# Patient Record
Sex: Female | Born: 1965 | Race: Black or African American | Hispanic: No | Marital: Married | Smoking: Never smoker
Health system: Southern US, Community
[De-identification: ages and names within clinical notes are randomized; demographics above are authoritative.]

## PROBLEM LIST (undated history)

## (undated) DIAGNOSIS — E7800 Pure hypercholesterolemia, unspecified: Secondary | ICD-10-CM

## (undated) DIAGNOSIS — E669 Obesity, unspecified: Secondary | ICD-10-CM

## (undated) DIAGNOSIS — E66811 Obesity, class 1: Secondary | ICD-10-CM

## (undated) DIAGNOSIS — B009 Herpesviral infection, unspecified: Secondary | ICD-10-CM

## (undated) DIAGNOSIS — K219 Gastro-esophageal reflux disease without esophagitis: Secondary | ICD-10-CM

## (undated) HISTORY — DX: Obesity, unspecified: E66.9

## (undated) HISTORY — DX: Pure hypercholesterolemia, unspecified: E78.00

## (undated) HISTORY — DX: Obesity, class 1: E66.811

## (undated) HISTORY — DX: Gastro-esophageal reflux disease without esophagitis: K21.9

---

## 2005-10-25 ENCOUNTER — Ambulatory Visit: Payer: Self-pay | Admitting: Internal Medicine

## 2008-01-05 ENCOUNTER — Telehealth: Payer: Self-pay | Admitting: Internal Medicine

## 2008-09-08 ENCOUNTER — Encounter: Payer: Self-pay | Admitting: Obstetrics & Gynecology

## 2008-09-08 ENCOUNTER — Ambulatory Visit: Payer: Self-pay | Admitting: Obstetrics & Gynecology

## 2008-09-09 ENCOUNTER — Encounter: Payer: Self-pay | Admitting: Obstetrics & Gynecology

## 2008-09-09 LAB — CONVERTED CEMR LAB: Clue Cells Wet Prep HPF POC: NONE SEEN

## 2008-09-13 ENCOUNTER — Encounter: Admission: RE | Admit: 2008-09-13 | Discharge: 2008-09-13 | Payer: Self-pay | Admitting: Obstetrics & Gynecology

## 2008-12-17 ENCOUNTER — Ambulatory Visit: Payer: Self-pay | Admitting: Family Medicine

## 2008-12-21 ENCOUNTER — Ambulatory Visit: Payer: Self-pay | Admitting: Family Medicine

## 2008-12-21 DIAGNOSIS — E669 Obesity, unspecified: Secondary | ICD-10-CM

## 2008-12-22 LAB — CONVERTED CEMR LAB
ALT: 22 units/L (ref 0–35)
AST: 15 units/L (ref 0–37)
Albumin: 4.3 g/dL (ref 3.5–5.2)
BUN: 16 mg/dL (ref 6–23)
Calcium: 9 mg/dL (ref 8.4–10.5)
Cholesterol: 227 mg/dL — ABNORMAL HIGH (ref 0–200)
HCT: 39.9 % (ref 36.0–46.0)
Hemoglobin: 12.4 g/dL (ref 12.0–15.0)
LDL Cholesterol: 148 mg/dL — ABNORMAL HIGH (ref 0–99)
Potassium: 4 meq/L (ref 3.5–5.3)
RDW: 14.4 % (ref 11.5–15.5)
Sodium: 137 meq/L (ref 135–145)
Total CHOL/HDL Ratio: 3.9
VLDL: 21 mg/dL (ref 0–40)

## 2009-09-05 ENCOUNTER — Telehealth: Payer: Self-pay | Admitting: Internal Medicine

## 2009-11-02 ENCOUNTER — Ambulatory Visit: Payer: Self-pay | Admitting: Obstetrics & Gynecology

## 2009-11-07 ENCOUNTER — Ambulatory Visit: Payer: Self-pay | Admitting: Family Medicine

## 2009-11-07 DIAGNOSIS — S99919A Unspecified injury of unspecified ankle, initial encounter: Secondary | ICD-10-CM

## 2009-11-07 DIAGNOSIS — S8990XA Unspecified injury of unspecified lower leg, initial encounter: Secondary | ICD-10-CM | POA: Insufficient documentation

## 2009-11-07 DIAGNOSIS — S99929A Unspecified injury of unspecified foot, initial encounter: Secondary | ICD-10-CM

## 2009-11-10 ENCOUNTER — Telehealth (INDEPENDENT_AMBULATORY_CARE_PROVIDER_SITE_OTHER): Payer: Self-pay | Admitting: *Deleted

## 2009-11-17 ENCOUNTER — Telehealth: Payer: Self-pay | Admitting: Family Medicine

## 2009-11-28 ENCOUNTER — Ambulatory Visit: Payer: Self-pay | Admitting: Family Medicine

## 2009-11-30 ENCOUNTER — Ambulatory Visit: Payer: Self-pay | Admitting: Diagnostic Radiology

## 2009-11-30 ENCOUNTER — Ambulatory Visit (HOSPITAL_BASED_OUTPATIENT_CLINIC_OR_DEPARTMENT_OTHER): Admission: RE | Admit: 2009-11-30 | Discharge: 2009-11-30 | Payer: Self-pay | Admitting: Family Medicine

## 2010-01-23 ENCOUNTER — Ambulatory Visit
Admission: RE | Admit: 2010-01-23 | Discharge: 2010-01-23 | Payer: Self-pay | Source: Home / Self Care | Attending: Family Medicine | Admitting: Family Medicine

## 2010-02-07 ENCOUNTER — Telehealth: Payer: Self-pay | Admitting: Internal Medicine

## 2010-02-14 NOTE — Assessment & Plan Note (Signed)
Summary: L knee pain x 1 dy rm 5   Vital Signs:  Patient Profile:   45 Years Old Female CC:      L knee paim x 1 dy Height:     65 inches Weight:      202 pounds O2 Sat:      100 % O2 treatment:    Room Air Temp:     98.7 degrees F oral Pulse rate:   67 / minute Pulse rhythm:   regular Resp:     16 per minute BP sitting:   105 / 73  (left arm) Cuff size:   regular  Vitals Entered By: Areta Haber CMA (November 07, 2009 7:32 PM)                  Current Allergies: No known allergies History of Present Illness Chief Complaint: L knee paim x 1 dy History of Present Illness:  Subjective:  Patient complains of slipping on wet floor in grocery store yesterday, landing on left anterior knee.  Has had persistent pain anteriorly, worse when walking.  Current Problems: CONTUSION, LEFT KNEE (ICD-924.11) KNEE INJURY, LEFT (ICD-959.7) OBESITY, UNSPECIFIED (ICD-278.00) HEALTH MAINTENANCE EXAM (ICD-V70.0) URI (ICD-465.9) FAMILY HISTORY DIABETES 1ST DEGREE RELATIVE (ICD-V18.0)   Current Meds PANTOPRAZOLE SODIUM 40 MG TBEC (PANTOPRAZOLE SODIUM) 1 tablet by mouth once daily PHENTERMINE HCL 37.5 MG CAPS (PHENTERMINE HCL) Take 1 tablet by mouth once a day in the AM.  REVIEW OF SYSTEMS Constitutional Symptoms      Denies fever, chills, night sweats, weight loss, weight gain, and fatigue.  Eyes       Denies change in vision, eye pain, eye discharge, glasses, contact lenses, and eye surgery. Ear/Nose/Throat/Mouth       Denies hearing loss/aids, change in hearing, ear pain, ear discharge, dizziness, frequent runny nose, frequent nose bleeds, sinus problems, sore throat, hoarseness, and tooth pain or bleeding.  Respiratory       Denies dry cough, productive cough, wheezing, shortness of breath, asthma, bronchitis, and emphysema/COPD.  Cardiovascular       Denies murmurs, chest pain, and tires easily with exhertion.    Gastrointestinal       Denies stomach pain, nausea/vomiting,  diarrhea, constipation, blood in bowel movements, and indigestion. Genitourniary       Denies painful urination, kidney stones, and loss of urinary control. Neurological       Denies paralysis, seizures, and fainting/blackouts. Musculoskeletal       Complains of muscle pain and decreased range of motion.      Denies joint pain, joint stiffness, redness, swelling, muscle weakness, and gout.      Comments: L knee pain x 1 dy Skin       Denies bruising, unusual mles/lumps or sores, and hair/skin or nail changes.  Psych       Denies mood changes, temper/anger issues, anxiety/stress, speech problems, depression, and sleep problems. Other Comments: Pt states she fell in grocery store on 11/06/09.   Past History:  Past Medical History: Last updated: 12/21/2008 Acid Reflux GYN - Dr. Nicholaus Bloom.     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 Never Smoked Alcohol use-no Drug use-no Regular exercise-yes Married  Risk Factors:  Alcohol Use: 0 (12/21/2008) Exercise: yes (12/17/2008)  Risk Factors: Smoking Status: never (12/21/2008)   Objective:  No acute distress   Left knee:  No effusion,  erythema, or warmth.  Knee stable, negative drawer test.  McMurray test negative.  Tenderness and mild swelling over mid-patella.  Knee has full range of motion.  Distal neurovascular intact   LEFT KNEE - COMPLETE 4+ VIEW   Comparison: None.   Findings: Imaged bones, joints and soft tissues appear normal.   IMPRESSION: Negative exam.  Assessment New Problems: CONTUSION, LEFT KNEE (ICD-924.11) KNEE INJURY, LEFT (ICD-959.7)  POSSIBLE  MILD KNEE SPRAIN  Plan New Orders: T-DG Knee Complete 4 Views*L* [73564] Ace Wraps 3-5 in/yard  [A6449] Est. Patient Level II [16109] Planning Comments:   Ace wrap applied:  wear for 3 to 5 days.   Continue ice pack several times daily until improved.  Continue ibuprofen. At risk for developing pre-patellar bursitis ([facility] information and instruction patient handout given).  Begin knee exercises Follow-up with orthopedist if not improving 10 to 14 days.   The patient and/or caregiver has been counseled thoroughly with regard to medications prescribed including dosage, schedule, interactions, rationale for use, and possible side effects and they verbalize understanding.  Diagnoses and expected course of recovery discussed and will return if not improved as expected or if the condition worsens. Patient and/or caregiver verbalized understanding.   Orders Added: 1)  T-DG Knee Complete 4 Views*L* [73564] 2)  Ace Wraps 3-5 in/yard  [A6449] 3)  Est. Patient Level II [60454]

## 2010-02-14 NOTE — Progress Notes (Signed)
  Phone Note Outgoing Call Call back at [facility] Phone [phone]   Call placed by: Lajean Saver RN,  November 10, 2009 12:30 PM Call placed to: Patient Summary of Call: Callback: No answer. Message left with reason for call and call back with questions or concerns. Reminded pateint to do knee exercises.

## 2010-02-14 NOTE — Progress Notes (Signed)
Summary: Wanting MRI  Phone Note Call from Patient Call back at [phone]- cell #   Caller: Patient Call For: Bowen Summary of Call: Pt seen in UC 1 week ago from a fall she took in [employer]. Knee is still hurting and wanted to know if she could get an MRI done. Please advise Initial call taken by: Kathlene November LPN,  November 17, 2009 3:01 PM  Follow-up for Phone Call        needs OV in our office-- can be done next wk. Follow-up by: Seymour Bars DO,  November 17, 2009 8:28 PM  Additional Follow-up for Phone Call Additional follow up Details #1::        Called and keft pt a detailed message informing her she would need a ov and to call us back to schedule .Michaelle Copas  November 18, 2009 4:41 PM  Additional Follow-up by: Michaelle Copas,  November 18, 2009 4:41 PM

## 2010-02-14 NOTE — Assessment & Plan Note (Signed)
Summary: LEFT KNEE PAIN AND SWELLING/NP/LP   Vital Signs:  Patient profile:   45 year old female Height:      65 inches (165.10 cm) Weight:      203.8 pounds (92.64 kg) BMI:     34.04 Temp:     98.1 degrees F (36.72 degrees C) oral Pulse rate:   71 / minute BP sitting:   108 / 78  (left arm)  Vitals Entered By: Baxter Hire) (November 28, 2009 3:32 PM) CC: left knee pain Pain Assessment Patient in pain? yes     Location: left knee Intensity: 3 Type: aching Onset of pain  pain since October 23, 11  Does patient need assistance? Functional Status Self care Ambulation Normal   Primary Care Provider:  Nani Gasser MD  CC:  left knee pain.  History of Present Illness: 45 yo F here for left knee pain  Patient reports about 3 weeks ago she was in [employer] Slipped on a wet floor and fell directly down onto her left knee Did not feel a pop Did have swelling and pain immediately after Seemed to be improving somewhat Now with more lateral left knee pain as well as the anterior pain + catching but no locking or giving out. Still swelling on her in this area Tried ibuprofen, icing, and ACE wrap without much help. No prior knee injuries.  Habits & Providers  Alcohol-Tobacco-Diet     Alcohol drinks/day: 0     Tobacco Status: never  Medications Prior to Update: 1)  Pantoprazole Sodium 40 Mg Tbec (Pantoprazole Sodium) .Marland Kitchen.. 1 Tablet By Mouth Once Daily 2)  Phentermine Hcl 37.5 Mg Caps (Phentermine Hcl) .... Take 1 Tablet By Mouth Once A Day in The Am.  Allergies (verified): No Known Drug Allergies  Family History: Reviewed history from 12/21/2008 and no changes required. Family History Diabetes 1st degree relative Family History High cholesterol Family History Hypertension Family History Other cancer, Father died stomach and endometrial Obesity  Social History: Reviewed history from 12/21/2008 and no changes required. Vocal Coach.  Masters.  Married  to [employer] with 2 kids. Never Smoked Alcohol use-no Drug use-no Regular exercise-yes Married  Physical Exam  General:  Well-developed,well-nourished,in no acute distress; alert,appropriate and cooperative throughout examination Msk:  L knee: No gross deformity, ecchymoses.  Mild swelling prepatellar bursa. TTP lateral joint line, anterior patella. FROM with pain on full flexion. Negative ant/post drawers. Negative valgus/varus testing. Negative lachmanns. Mcmurrays positive laterally.  Negative patellar apprehension, clarkes. NV intact distally.  R knee: No gross deformity, ecchymoses, swelling.   No TTP. FROM. Negative ant/post drawers. Negative valgus/varus testing. Negative lachmanns. Negative mcmurrays, patellar apprehension, clarkes. NV intact distally.   Impression & Recommendations:  Problem # 1:  KNEE INJURY, LEFT (ICD-959.7) Assessment Deteriorated Injury beyond 3 weeks now with catching, lateral joint line tenderness and + mcmurrays.  Will order MRI and contact patient with results and how we are to proceed.  Aleve two times a day, ace wrap, icing.  Quad rehab shown in meantime.  Orders: MRI without Contrast (MRI w/o Contrast)  Complete Medication List: 1)  Pantoprazole Sodium 40 Mg Tbec (Pantoprazole sodium) .Marland Kitchen.. 1 tablet by mouth once daily 2)  Phentermine Hcl 37.5 Mg Caps (Phentermine hcl) .... Take 1 tablet by mouth once a day in the am.  Patient Instructions: 1)  This could certainly still be a bruise and meniscal contusion (cartilage bruise) but I am worried you may have torn cartilage on the outside  of your knee. 2)  We will contact you with the MRI appointment - after the results come back I will call you and tell you how we need to proceed. 3)  Take aleve 2 tabs by mouth two times a day with food for pain, swelling, and inflammation. 4)  An ACE wrap can help put compression on this - this and elevation will help with swelling too. 5)  Ice as needed  for 15 minutes at a time. 6)  Crutches only if needed. 7)  Do simple straight leg raise and quad set exercises to keep your quad strong. 8)  Follow up will be determined by the MRI result.   Orders Added: 1)  MRI without Contrast [MRI w/o Contrast] 2)  New Patient Level III [16109]

## 2010-02-14 NOTE — Progress Notes (Signed)
Summary: speak to nurse   Phone Note Call from Patient Call back at Home Phone [phone]   Caller: Patient Call For: Dr Marina Goodell Reason for Call: Talk to Nurse Summary of Call: Patient wants to speak directly to nurse Initial call taken by: Tawni Levy,  September 05, 2009 2:09 PM  Follow-up for Phone Call        Pt. asking  ifr Dr.Perry does the new T.I.F. 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 Has not come for f/u since 07 and I offered her appt. in Sept. but she is just going to to schedule appt. with Dr.Payton Earlene Plater in [place] who does this surgery. Follow-up by: Teryl Lucy RN,  September 05, 2009 3:04 PM  Additional Follow-up for Phone Call Additional follow up Details #1::        We don't perform this, nor do we endorse it at this time Additional Follow-up by: Hilarie Fredrickson MD,  September 05, 2009 3:24 PM    Additional Follow-up for Phone Call Additional follow up Details #2::    Message left to call back  Teryl Lucy RN  September 05, 2009 3:43 PM Attempted to reach pt. as she has not called back and machine memory is currently full.  Teryl Lucy RN  September 07, 2009 9:55 AM   pt machine is full not accepting messages Chales Abrahams CMA Duncan Dull)  September 09, 2009 9:02 AM

## 2010-02-16 NOTE — Assessment & Plan Note (Signed)
Summary: FINAL CHECK UP/LP   Vital Signs:  Patient profile:   45 year old female Pulse rate:   77 / minute BP sitting:   128 / 83  Primary Care Provider:  Nani Gasser MD   History of Present Illness: 46 yo F here for f/u left knee pain  Initial injury about 2 1/2 months ago Slipped on a wet floor and fell directly down onto her left knee Did not feel a pop Did have swelling and pain immediately after Was having some catching within knee. X-rays and MRI done - no evidence of meniscal tear or ligament tear - edema within patella and patellar tendon Took aleve, iced area, did home exercises and has improved Now with no pain - 1 week ago was jumping side to side in exercise program and felt twinge outside left knee that resolved within a few minutes and no lingering issues No other complaints - no giving out.   Problems Prior to Update: 1)  Knee Injury, Left  (ICD-959.7) 2)  Obesity, Unspecified  (ICD-278.00) 3)  Health Maintenance Exam  (ICD-V70.0) 4)  Family History Diabetes 1st Degree Relative  (ICD-V18.0)  Medications Prior to Update: 1)  Pantoprazole Sodium 40 Mg Tbec (Pantoprazole Sodium) .Marland Kitchen.. 1 Tablet By Mouth Once Daily 2)  Phentermine Hcl 37.5 Mg Caps (Phentermine Hcl) .... Take 1 Tablet By Mouth Once A Day in The Am.  Allergies (verified): No Known Drug Allergies  Physical Exam  General:  Well-developed,well-nourished,in no acute distress; alert,appropriate and cooperative throughout examination Msk:  L knee: No gross deformity, ecchymoses, swelling. No TTP throughout knee. FROM without pain. Negative ant/post drawers. Negative valgus/varus testing. Negative lachmanns. Negative mcmurrays and Apleys.  Negative patellar apprehension, clarkes. NV intact distally.   Impression & Recommendations:  Problem # 1:  KNEE INJURY, LEFT (ICD-959.7) Assessment Improved Knee pain completely improved/resolved.  Cleared for all activities.  Will call me with any  further concerns.  Complete Medication List: 1)  Pantoprazole Sodium 40 Mg Tbec (Pantoprazole sodium) .Marland Kitchen.. 1 tablet by mouth once daily 2)  Phentermine Hcl 37.5 Mg Caps (Phentermine hcl) .... Take 1 tablet by mouth once a day in the am.   Orders Added: 1)  Est. Patient Level II [21308]

## 2010-02-22 NOTE — Progress Notes (Signed)
Summary: T.I.F. referral   Phone Note Call from Patient Call back at [phone]   Caller: Patient Call For: Dr Marina Goodell Reason for Call: Talk to Nurse Summary of Call: Patient wants to speak to nurse regarding a referral she is requesting. Initial call taken by: Tawni Levy,  February 07, 2010 10:59 AM  Follow-up for Phone Call        Pt. called here in August 2011 asking if Dr.Narayan Scull does the new T.I.F. procedure for GERD and she was offered appt. with Dr.Siler Mavis since she had not been seen since 07 but declined saying she was going to seek a dr. who did procedure.She called today requesting a referral from Dr.Cher Franzoni as she is going to have procedure in [place]. by Dr.Mark Katrinka Blazing and was told at his office she needs a referral from Dr.Milley Vining. Follow-up by: Teryl Lucy RN,  February 07, 2010 11:16 AM  Additional Follow-up for Phone Call Additional follow up Details #1::        I AM NOT FAMILIAR WITH THE PROCEDURE AND WILL NOT REFER HER. Additional Follow-up by: Hilarie Fredrickson MD,  February 07, 2010 11:22 AM    Additional Follow-up for Phone Call Additional follow up Details #2::    message left to call back.   Teryl Lucy RN  February 07, 2010 3:42 PM Pt. will get referral from [facility] Follow-up by: Teryl Lucy RN,  February 13, 2010 4:27 PM

## 2010-03-25 ENCOUNTER — Encounter: Payer: Self-pay | Admitting: *Deleted

## 2010-05-30 NOTE — Assessment & Plan Note (Signed)
[name], [name]                ACCOUNT NO.:  [id number]   MEDICAL RECORD NO.:  [id number]          PATIENT TYPE:  POB   LOCATION:  [facility] at [place]         FACILITY:  [facility]   PHYSICIAN:  Anne Bossier, MD        DATE OF BIRTH:  02-09-1965   DATE OF SERVICE:  09/08/2008                                  CLINIC NOTE   Anne Mason is a 45 year old, married, black, gravida 2, para 2.  She has  a 91-year-old daughter and a 65 year old son.  She comes in here for an  annual exam.  Her complaint is that of some premenstrual night sweats  and some premenstrual vaginal itching.  She is recently treated for a  yeast infection after taking amoxicillin.   PAST MEDICAL HISTORY:  She has reflux and she is mildly overweight.   REVIEW OF SYSTEMS:  She has always had normal Paps, but her last one was  done in 2008.  She has been married for 18 years.  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   FAMILY HISTORY:  Positive for breast cancer in her maternal aunt  (postmenopausal), endometrial cancer in her mother, and she denies  family history of colon cancer.   ALLERGIES:  No to LATEX allergy.  No known drug allergies.   SOCIAL HISTORY:  Negative for tobacco, alcohol, or drug use, but no  surgeries in the past.   MEDICATIONS:  She takes Protonix daily 40 mg a day.   PHYSICAL EXAMINATION:  VITAL SIGNS:  Height 5-1/2, weight 203 pounds,  blood pressure 104/72, pulse 74.  HEENT:  Normal.  BREASTS:  Normal bilaterally.  HEART:  Regular rate and rhythm.  LUNGS:  Clear to auscultation bilaterally.  ABDOMEN:  Obese.  No palpable hepatosplenomegaly.  EXTERNAL GENITALIA:  No lesions.  CERVIX:  There is a large polyp on a stalk.  It was removed gently with  the ring forceps.  Pap smear was obtained.  BIMANUAL:  An 8-week size uterus that is somewhat mobile and  retroverted.  Adnexa are not palpable.   Annual exam, I have checked a Pap smear.  We will  schedule a mammogram.  We will schedule her for a referral to [facility] per her  request.  With regard to her uterine enlargement, I have ordered a  ultrasound.  With regard to her discharge, I have ordered a wet prep and  we will treat accordingly.      Anne Bossier, MD     MCD/MEDQ  D:  09/08/2008  T:  09/09/2008  Job:  [id number]

## 2010-05-30 NOTE — Assessment & Plan Note (Signed)
NAMEAMBERLIE, GAILLARD                ACCOUNT NO.:  [id number]   MEDICAL RECORD NO.:  [id number]          PATIENT TYPE:  POB   LOCATION:  [facility] [place]         FACILITY[facility]   PHYSICIAN:  Allie Bossier, MD        DATE OF BIRTH:  [birth year]   DATE OF SERVICE:  11/02/2009                                  CLINIC NOTE   Ms. Hout is a 45 year old married black gravida 2, para 2.  She has an  65-year-old daughter and 55 year old son.  She comes in for annual exam.  She has no particular complaints.   PAST MEDICAL HISTORY:  Reflux, she is mildly overweight.   REVIEW OF SYSTEMS:  She is a homemaker.  Her husband is an attorney that  specializes in Chief of Staff.  She travels internationally  frequently.  She is also a Manufacturing engineer.  She is married for 19 years.   FAMILY HISTORY:  Her maternal aunt had postmenopausal breast cancer.  Her mother had endometrial cancer and her father recently died of  stomach cancer.  She denies family history of colon cancer.   ALLERGIES:  No known drug allergies.  No to latex allergies.   SOCIAL HISTORY:  Negative for tobacco, alcohol, or drug use.   PAST SURGICAL HISTORY:  None.   MEDICATIONS:  1. Protonix 40 mg daily.  2. Multivitamin daily.   PHYSICAL EXAMINATION:  VITAL SIGNS:  Height 5 feet 5.5 inches, weight  200 pounds, blood pressure 115/79, pulse 71.  HEENT:  Normal.  BREASTS:  Normal bilaterally.  HEART:  Regular rate and rhythm.  LUNGS:  Clear to auscultation bilaterally.  ABDOMEN:  Overweight.  No palpable hepatosplenomegaly.  EXTERNAL GENITALIA:  No lesions.  Cervix appears normal.  Bimanual exam,  8-week size anteverted mobile, nontender uterus consistent with fibroids  and unchanged from previous exam.  Adnexa nontender.  No masses.   ASSESSMENT AND PLAN:  Annual exam.  I have checked Pap smear and  recommended self-breast and self-vulvar exams.  We will make sure that  her mammogram is up-to-date.      Allie Bossier,  MD     MCD/MEDQ  D:  11/02/2009  T:  11/03/2009  Job:  [id number]

## 2010-06-02 NOTE — Assessment & Plan Note (Signed)
[facility]                           GASTROENTEROLOGY OFFICE NOTE   Anne Mason, Anne Mason                         MRN:          [id number]  DATE:10/25/2005                            DOB:          1965/04/24    OFFICE CONSULTATION NOTE:  Patient self-referred.   REASON FOR CONSULTATION:  Chronic reflux disease.   HISTORY:  This is a 45 year old African-American female who presents herself  for evaluation and treatment of chronic reflux disease.  She reports  developing problems with indigestion, heartburn and associated hoarseness  approximately 3-4 years ago.  She was evaluated in [place], [place], and  underwent upper endoscopy which she reports was unremarkable.  She was  initially placed on Prilosec, which resulted in gastric distress.  She was  subsequently changed to Protonix daily.  On the medication, over the course  of 3 months her symptoms completely resolved.  Subsequently she wished to  adhere to strict lifestyle modifications and came off her medication.  She  did well for about 2-1/2 years.  However, over the past 3 months since  moving to [place] she has had recurrent daily heartburn after each  meal.  No nausea, vomiting or dysphagia.  Some bloating and rare  constipation, which is not new.  No melena or hematochezia.  Her weight has  been stable over the past 5 years.  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   PAST MEDICAL HISTORY:  Gastroesophageal reflux disease.   PAST SURGICAL HISTORY:  None.   ALLERGIES:  No known drug allergies.   CURRENT MEDICATIONS:  None.   FAMILY HISTORY:  Negative for gastrointestinal disorders.  Mother with a  history of diabetes and ovarian cancer.   SOCIAL HISTORY:  The patient is married with 2 children.  She has a master's  degree.  She is employed as a Regulatory affairs officer.  She does not smoke.  Has a  glass of wine once every two weeks or so.   REVIEW OF  SYSTEMS:  Per diagnostic evaluation form.   PHYSICAL EXAMINATION:  GENERAL:  A pleasant, well-appearing female in no  acute distress.  VITAL SIGNS:  Blood pressure 108/70, heart rate 60, weight is 202 pounds.  She is 5 feet 5 inches in height.  HEENT:  Sclerae are anicteric.  Conjunctivae are pink.  Oral mucosa intact.  LUNGS:  Clear.  HEART:  Regular.  ABDOMEN:  Soft without tenderness, mass or hernia.  Good bowel sounds heard.   IMPRESSION:  This is a 45 year old female with chronic reflux disease  without alarm symptoms.  Prior endoscopy in [place] 3 years ago reportedly  negative.   RECOMMENDATIONS:  1. A long discussion today regarding the pathophysiology and treatment of      reflux disease.  2. Supplemental literature provided, including a sheet on reflux      precautions.  3. Initiate Protonix 40 mg daily.  We have provided multiple samples as      well as a prescription with multiple refills.  4. Office follow-up in one year unless interval  questions or problems.            ______________________________  Wilhemina Bonito. Eda Keys., MD      JNP/MedQ  DD:  10/25/2005  DT:  10/27/2005  Job #:  [id number]

## 2010-07-25 ENCOUNTER — Other Ambulatory Visit: Payer: Self-pay | Admitting: Obstetrics & Gynecology

## 2010-07-25 DIAGNOSIS — Z1231 Encounter for screening mammogram for malignant neoplasm of breast: Secondary | ICD-10-CM

## 2010-08-08 ENCOUNTER — Ambulatory Visit
Admission: RE | Admit: 2010-08-08 | Discharge: 2010-08-08 | Disposition: A | Discharge status: Home / Self Care | Payer: Self-pay | Source: Ambulatory Visit | Attending: Obstetrics & Gynecology | Admitting: Obstetrics & Gynecology

## 2010-08-08 DIAGNOSIS — Z1231 Encounter for screening mammogram for malignant neoplasm of breast: Secondary | ICD-10-CM

## 2010-08-24 ENCOUNTER — Encounter: Payer: Self-pay | Admitting: *Deleted

## 2010-08-30 ENCOUNTER — Encounter: Payer: Self-pay | Admitting: Obstetrics & Gynecology

## 2010-08-30 ENCOUNTER — Ambulatory Visit (INDEPENDENT_AMBULATORY_CARE_PROVIDER_SITE_OTHER): Payer: BC Managed Care – PPO | Admitting: Obstetrics & Gynecology

## 2010-08-30 DIAGNOSIS — B009 Herpesviral infection, unspecified: Secondary | ICD-10-CM

## 2010-08-30 DIAGNOSIS — R32 Unspecified urinary incontinence: Secondary | ICD-10-CM

## 2010-08-30 MED ORDER — VALACYCLOVIR HCL 500 MG PO TABS: 500.0000 mg | ORAL_TABLET | Freq: Two times a day (BID) | ORAL | Status: AC

## 2010-08-30 MED ORDER — PHENAZOPYRIDINE HCL 200 MG PO TABS: 200.0000 mg | ORAL_TABLET | Freq: Three times a day (TID) | ORAL | Status: AC | PRN

## 2010-08-30 NOTE — Progress Notes (Signed)
  Subjective:    Patient ID: Anne Mason, female    DOB: [birth year], 45 y.o.   MRN: [id number]  HPI Anne Mason is here for 2 complaints. Her first complaint is that of a recurrent left labial lesion for the last several years. She quite the lesion with using bath soaps, and says that it occurs approximately 3 times a year always in the same spot. Her second complaint is that of vaginal wetness that has been occurring for the last 6 months. She is not actually sure that its urine. When I discussed the symptoms of genuine stress urinary incontinence as well as urge incontinence, she says that these scenarios do not fit her symptoms. She does complain of what sounds like excessive sweating.   Review of Systems She and her husband do indicate an oral sex. Both of them had oral fever blisters.     Objective:   Physical Exam   There is a 1 cm ulcerated lesion on her left labia majora consistent with herpes infection. She does not have urine leaking with Valsalva.    Assessment & Plan:  #1. Vulvar ulcer consistent with herpes. I have offered to run blood work to confirm this versus just treating her with Valtrex. She prefers just been treated. She will take the Valtrex on a when necessary basis. #2. Vaginal wetness. I will give her a prescription for a pyridium and we will determine if her vaginal wetness is urinary or vaginal. Based on these results, I will treat her appropriately.

## 2011-09-11 ENCOUNTER — Other Ambulatory Visit: Payer: Self-pay | Admitting: Obstetrics & Gynecology

## 2011-09-11 DIAGNOSIS — Z1231 Encounter for screening mammogram for malignant neoplasm of breast: Secondary | ICD-10-CM

## 2011-09-18 ENCOUNTER — Ambulatory Visit (INDEPENDENT_AMBULATORY_CARE_PROVIDER_SITE_OTHER): Payer: BC Managed Care – PPO

## 2011-09-18 DIAGNOSIS — R928 Other abnormal and inconclusive findings on diagnostic imaging of breast: Secondary | ICD-10-CM

## 2011-09-18 DIAGNOSIS — Z1231 Encounter for screening mammogram for malignant neoplasm of breast: Secondary | ICD-10-CM

## 2011-09-20 ENCOUNTER — Ambulatory Visit: Payer: BC Managed Care – PPO | Admitting: Sports Medicine

## 2011-09-20 ENCOUNTER — Other Ambulatory Visit: Payer: Self-pay | Admitting: Obstetrics & Gynecology

## 2011-09-20 DIAGNOSIS — R928 Other abnormal and inconclusive findings on diagnostic imaging of breast: Secondary | ICD-10-CM

## 2011-09-25 ENCOUNTER — Ambulatory Visit
Admission: RE | Admit: 2011-09-25 | Discharge: 2011-09-25 | Disposition: A | Discharge status: Home / Self Care | Payer: BC Managed Care – PPO | Source: Ambulatory Visit | Attending: Obstetrics & Gynecology | Admitting: Obstetrics & Gynecology

## 2011-09-25 DIAGNOSIS — R928 Other abnormal and inconclusive findings on diagnostic imaging of breast: Secondary | ICD-10-CM

## 2011-09-27 ENCOUNTER — Ambulatory Visit: Payer: BC Managed Care – PPO | Admitting: Obstetrics & Gynecology

## 2011-10-09 ENCOUNTER — Ambulatory Visit (INDEPENDENT_AMBULATORY_CARE_PROVIDER_SITE_OTHER): Payer: BC Managed Care – PPO | Admitting: Obstetrics & Gynecology

## 2011-10-09 ENCOUNTER — Encounter: Payer: Self-pay | Admitting: Obstetrics & Gynecology

## 2011-10-09 VITALS — BP 111/77 | HR 78 | Temp 98.6°F | Resp 16 | Ht 65.0 in | Wt 210.0 lb

## 2011-10-09 DIAGNOSIS — Z0000 Encounter for general adult medical examination without abnormal findings: Secondary | ICD-10-CM

## 2011-10-09 DIAGNOSIS — Z124 Encounter for screening for malignant neoplasm of cervix: Secondary | ICD-10-CM

## 2011-10-09 DIAGNOSIS — Z23 Encounter for immunization: Secondary | ICD-10-CM

## 2011-10-09 MED ORDER — VALACYCLOVIR HCL 1 G PO TABS: 1000.0000 mg | ORAL_TABLET | Freq: Two times a day (BID) | ORAL | Status: DC

## 2011-10-09 NOTE — Progress Notes (Signed)
Subjective:    Anne Mason is a 46 y.o. female who presents for an annual exam. The patient has no complaints today. She never did get the Valtrex script filled, but she is ready to get it filled now.The patient is sexually active. GYN screening history: last pap: was normal. The patient wears seatbelts: yes. The patient participates in regular exercise: yes. Has the patient ever been transfused or tattooed?: no. The patient reports that there is not domestic violence in her life.   Menstrual History: OB History    Grav Para Term Preterm Abortions TAB SAB Ect Mult Living   2 2 2       2       Menarche age: 24 Patient's last menstrual period was 09/18/2011.    The following portions of the patient's history were reviewed and updated as appropriate: allergies, current medications, past family history, past medical history, past social history, past surgical history and problem list.  Review of Systems A comprehensive review of systems was negative. She has been married for 22 years. She denies dyspareunia. 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 Her mammogram is UTD.   Objective:    BP 111/77  Pulse 78  Temp 98.6 F (37 C) (Oral)  Resp 16  Ht 5\' 5"  (1.651 m)  Wt 210 lb (95.255 kg)  BMI 34.95 kg/m2  LMP 09/18/2011  General Appearance:    Alert, cooperative, no distress, appears stated age  Head:    Normocephalic, without obvious abnormality, atraumatic  Eyes:    PERRL, conjunctiva/corneas clear, EOM's intact, fundi    benign, both eyes  Ears:    Normal TM's and external ear canals, both ears  Nose:   Nares normal, septum midline, mucosa normal, no drainage    or sinus tenderness  Throat:   Lips, mucosa, and tongue normal; teeth and gums normal  Neck:   Supple, symmetrical, trachea midline, no adenopathy;    thyroid:  no enlargement/tenderness/nodules; no carotid   bruit or JVD  Back:     Symmetric, no curvature, ROM normal, no CVA tenderness  Lungs:     Clear to auscultation  bilaterally, respirations unlabored  Chest Wall:    No tenderness or deformity   Heart:    Regular rate and rhythm, S1 and S2 normal, no murmur, rub   or gallop  Breast Exam:    No tenderness, masses, or nipple abnormality  Abdomen:     Soft, non-tender, bowel sounds active all four quadrants,    no masses, no organomegaly  Genitalia:    Normal female with canker on left labia majora, no discharge or tenderness, NSSA, NT, no adnexal masses      Extremities:   Extremities normal, atraumatic, no cyanosis or edema  Pulses:   2+ and symmetric all extremities  Skin:   Skin color, texture, turgor normal, no rashes or lesions  Lymph nodes:   Cervical, supraclavicular, and axillary nodes normal  Neurologic:   CNII-XII intact, normal strength, sensation and reflexes    throughout  .    Assessment:    Healthy female exam.  Herpes   Plan:     Pap smear.  Flu shot Valtrex

## 2011-10-26 ENCOUNTER — Encounter: Payer: Self-pay | Admitting: Sports Medicine

## 2011-10-26 ENCOUNTER — Ambulatory Visit (INDEPENDENT_AMBULATORY_CARE_PROVIDER_SITE_OTHER): Payer: BC Managed Care – PPO | Admitting: Sports Medicine

## 2011-10-26 VITALS — BP 131/87 | HR 78 | Wt 213.0 lb

## 2011-10-26 DIAGNOSIS — E785 Hyperlipidemia, unspecified: Secondary | ICD-10-CM

## 2011-10-26 DIAGNOSIS — E669 Obesity, unspecified: Secondary | ICD-10-CM

## 2011-10-26 DIAGNOSIS — D509 Iron deficiency anemia, unspecified: Secondary | ICD-10-CM

## 2011-10-26 DIAGNOSIS — K219 Gastro-esophageal reflux disease without esophagitis: Secondary | ICD-10-CM

## 2011-10-26 DIAGNOSIS — Z0000 Encounter for general adult medical examination without abnormal findings: Secondary | ICD-10-CM

## 2011-10-26 DIAGNOSIS — Z299 Encounter for prophylactic measures, unspecified: Secondary | ICD-10-CM | POA: Insufficient documentation

## 2011-10-26 LAB — LIPID PANEL
Cholesterol: 227 mg/dL — ABNORMAL HIGH (ref 0–200)
HDL: 56 mg/dL (ref >39)
LDL Cholesterol: 151 mg/dL — ABNORMAL HIGH (ref 0–99)
Total CHOL/HDL Ratio: 4.1 ratio
Triglycerides: 100 mg/dL (ref <150)
VLDL: 20 mg/dL (ref 0–40)

## 2011-10-26 LAB — CBC
HCT: 34.2 % — ABNORMAL LOW (ref 36.0–46.0)
Hemoglobin: 11.3 g/dL — ABNORMAL LOW (ref 12.0–15.0)
MCH: 25.9 pg — ABNORMAL LOW (ref 26.0–34.0)
MCHC: 33 g/dL (ref 30.0–36.0)
MCV: 78.3 fL (ref 78.0–100.0)
Platelets: 265 K/uL (ref 150–400)
RBC: 4.37 MIL/uL (ref 3.87–5.11)
RDW: 15.1 % (ref 11.5–15.5)
WBC: 7.8 10*3/uL (ref 4.0–10.5)

## 2011-10-26 LAB — TSH: TSH: 0.764 u[IU]/mL (ref 0.350–4.500)

## 2011-10-26 LAB — COMPREHENSIVE METABOLIC PANEL
Albumin: 4 g/dL (ref 3.5–5.2)
Alkaline Phosphatase: 56 U/L (ref 39–117)
CO2: 25 mEq/L (ref 19–32)
Calcium: 9.2 mg/dL (ref 8.4–10.5)
Chloride: 104 mEq/L (ref 96–112)
Glucose, Bld: 84 mg/dL (ref 70–99)
Potassium: 4.3 mEq/L (ref 3.5–5.3)
Sodium: 136 mEq/L (ref 135–145)
Total Protein: 7.2 g/dL (ref 6.0–8.3)

## 2011-10-26 LAB — COMPREHENSIVE METABOLIC PANEL WITH GFR
ALT: 16 U/L (ref 0–35)
AST: 16 U/L (ref 0–37)
BUN: 11 mg/dL (ref 6–23)
Creat: 0.96 mg/dL (ref 0.50–1.10)
Total Bilirubin: 0.5 mg/dL (ref 0.3–1.2)

## 2011-10-26 MED ORDER — FERROUS SULFATE 325 (65 FE) MG PO TBEC: 325.0000 mg | DELAYED_RELEASE_TABLET | Freq: Three times a day (TID) | ORAL | Status: DC

## 2011-10-26 MED ORDER — ATORVASTATIN CALCIUM 40 MG PO TABS: 40.0000 mg | ORAL_TABLET | Freq: Every day | ORAL | Status: DC

## 2011-10-26 NOTE — Assessment & Plan Note (Addendum)
Pap done September 24. Her OB/GYN does these. Up-to-date on flu, and tetanus.

## 2011-10-26 NOTE — Assessment & Plan Note (Addendum)
CBC, CMET, lipids, TSH. We discussed avoiding carbohydrates. We also discussed decreasing caloric intake, as well as exercise. We will try this for several months, and if unsuccessful I think it would be appropriate to try weight loss medication.

## 2011-10-26 NOTE — Assessment & Plan Note (Signed)
Has failed conservative medical therapy. Would like a referral to Gen. surgery.

## 2011-10-26 NOTE — Patient Instructions (Addendum)
Check www.[facility].com for glycemic index. Come back in 3 months to check weight and discuss wt loss meds.

## 2011-10-26 NOTE — Progress Notes (Signed)
Subjective:    CC: Establish care.   HPI:  Obesity: She desires weight loss strategies. She has cut back a good amount of the meat in her diet, but still loads up on breads, and other carbohydrates.  Acid reflux: This is been present for a long time, was initially well controlled with proton pump inhibitors, but at this point is no longer under control. She gets sour brash, as well as throat clearing through the day. She desires a referral to Gen. surgery for consideration of surgical correction.  Past medical history, Surgical history, Family history, Social history, Allergies, and medications have been entered into the medical record, reviewed, and no changes needed.   Review of Systems: No headache, visual changes, nausea, vomiting, diarrhea, constipation, dizziness, abdominal pain, skin rash, fevers, chills, night sweats, swollen lymph nodes, weight loss, chest pain, body aches, joint swelling, muscle aches, or shortness of breath.   Objective:    General: Well Developed, well nourished, and in no acute distress.  Neuro: Alert and oriented x3, extra-ocular muscles intact.  HEENT: Normocephalic, atraumatic, pupils equal round reactive to light, neck supple, no masses, no lymphadenopathy, thyroid nonpalpable.  Skin: Warm and dry, no rashes noted.  Cardiac: Regular rate and rhythm, no murmurs rubs or gallops.  Respiratory: Clear to auscultation bilaterally. Not using accessory muscles, speaking in full sentences.  Abdominal: Soft, nontender, nondistended, positive bowel sounds, no masses, no organomegaly.  Musculoskeletal: Shoulder, elbow, wrist, hip, knee, ankle stable, and with full range of motion.  Impression and Recommendations:

## 2011-10-26 NOTE — Addendum Note (Signed)
Addended by: Monica Becton on: 10/26/2011 11:32 PM   Modules accepted: Orders

## 2011-11-12 ENCOUNTER — Ambulatory Visit (INDEPENDENT_AMBULATORY_CARE_PROVIDER_SITE_OTHER): Payer: BC Managed Care – PPO | Admitting: General Surgery

## 2011-12-03 ENCOUNTER — Ambulatory Visit (INDEPENDENT_AMBULATORY_CARE_PROVIDER_SITE_OTHER): Payer: BC Managed Care – PPO | Admitting: General Surgery

## 2012-10-29 ENCOUNTER — Ambulatory Visit (INDEPENDENT_AMBULATORY_CARE_PROVIDER_SITE_OTHER): Payer: BC Managed Care – PPO | Admitting: Obstetrics & Gynecology

## 2012-10-29 ENCOUNTER — Encounter: Payer: Self-pay | Admitting: Obstetrics & Gynecology

## 2012-10-29 VITALS — BP 105/71 | HR 68 | Ht 65.5 in | Wt 210.0 lb

## 2012-10-29 DIAGNOSIS — Z124 Encounter for screening for malignant neoplasm of cervix: Secondary | ICD-10-CM

## 2012-10-29 DIAGNOSIS — Z1151 Encounter for screening for human papillomavirus (HPV): Secondary | ICD-10-CM

## 2012-10-29 DIAGNOSIS — Z0000 Encounter for general adult medical examination without abnormal findings: Secondary | ICD-10-CM

## 2012-10-29 DIAGNOSIS — Z01419 Encounter for gynecological examination (general) (routine) without abnormal findings: Secondary | ICD-10-CM

## 2012-10-29 DIAGNOSIS — Z23 Encounter for immunization: Secondary | ICD-10-CM

## 2012-10-29 MED ORDER — OMEPRAZOLE 20 MG PO CPDR: 20.0000 mg | DELAYED_RELEASE_CAPSULE | Freq: Every day | ORAL | Status: DC

## 2012-10-29 MED ORDER — NORGESTREL-ETHINYL ESTRADIOL 0.3-30 MG-MCG PO TABS: ORAL_TABLET | ORAL | Status: DC

## 2012-10-29 MED ORDER — VALACYCLOVIR HCL 1 G PO TABS: 1000.0000 mg | ORAL_TABLET | Freq: Every day | ORAL | Status: DC

## 2012-10-29 NOTE — Progress Notes (Signed)
Subjective:    Anne Mason is a 47 y.o. female who presents for an annual exam. The patient has no complaints today. She would like a refill of valtrex for fever blisters and her omeprazole which she takes daily.  The patient is sexually active. GYN screening history: last pap: was normal. The patient wears seatbelts: yes. The patient participates in regular exercise: yes. Has the patient ever been transfused or tattooed?: no. The patient reports that there is not domestic violence in her life.   Menstrual History: OB History   Grav Para Term Preterm Abortions TAB SAB Ect Mult Living   2 2 2       2       Menarche age: 60  Patient's last menstrual period was 10/21/2012.    The following portions of the patient's history were reviewed and updated as appropriate: allergies, current medications, past family history, past medical history, past social history, past surgical history and problem list.  Review of Systems A comprehensive review of systems was negative. Married for 23 years, denies dyspareunia. 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 Both have had their Gardasil shots.   Objective:    BP 105/71  Pulse 68  Ht 5' 5.5" (1.664 m)  Wt 210 lb (95.255 kg)  BMI 34.4 kg/m2  LMP 10/21/2012  General Appearance:    Alert, cooperative, no distress, appears stated age  Head:    Normocephalic, without obvious abnormality, atraumatic  Eyes:    PERRL, conjunctiva/corneas clear, EOM's intact, fundi    benign, both eyes  Ears:    Normal TM's and external ear canals, both ears  Nose:   Nares normal, septum midline, mucosa normal, no drainage    or sinus tenderness  Throat:   Lips, mucosa, and tongue normal; teeth and gums normal  Neck:   Supple, symmetrical, trachea midline, no adenopathy;    thyroid:  no enlargement/tenderness/nodules; no carotid   bruit or JVD  Back:     Symmetric, no curvature, ROM normal, no CVA tenderness  Lungs:     Clear to auscultation bilaterally,  respirations unlabored  Chest Wall:    No tenderness or deformity   Heart:    Regular rate and rhythm, S1 and S2 normal, no murmur, rub   or gallop  Breast Exam:    No tenderness, masses, or nipple abnormality  Abdomen:     Soft, non-tender, bowel sounds active all four quadrants,    no masses, no organomegaly  Genitalia:    Normal female without lesion, discharge or tenderness, 8 week size midplane uterus, NT (same as exam documented in 2012), normal adnexal exam     Extremities:   Extremities normal, atraumatic, no cyanosis or edema  Pulses:   2+ and symmetric all extremities  Skin:   Skin color, texture, turgor normal, no rashes or lesions  Lymph nodes:   Cervical, supraclavicular, and axillary nodes normal  Neurologic:   CNII-XII intact, normal strength, sensation and reflexes    throughout  .    Assessment:    Healthy female exam.  menorrhagia   Plan:     Thin prep Pap smear. with cotesting Flu vaccine Schedule mammogram Rec SBE/SVE monthly I offered her continuous OCPs for treatment of menorrhagia Check CBC and TSH

## 2012-10-29 NOTE — Progress Notes (Signed)
Here today for gyn physical.  Would like to be checked to make sure bladder has not dropped.

## 2012-10-30 LAB — CBC
MCH: 24.9 pg — ABNORMAL LOW (ref 26.0–34.0)
MCHC: 32 g/dL (ref 30.0–36.0)
MCV: 77.9 fL — ABNORMAL LOW (ref 78.0–100.0)
Platelets: 286 10*3/uL (ref 150–400)
RBC: 4.61 MIL/uL (ref 3.87–5.11)
WBC: 6.7 10*3/uL (ref 4.0–10.5)

## 2012-10-31 LAB — URINE CULTURE

## 2013-11-16 ENCOUNTER — Ambulatory Visit (INDEPENDENT_AMBULATORY_CARE_PROVIDER_SITE_OTHER): Payer: BC Managed Care – PPO | Admitting: Obstetrics & Gynecology

## 2013-11-16 ENCOUNTER — Encounter: Payer: Self-pay | Admitting: Obstetrics & Gynecology

## 2013-11-16 VITALS — BP 113/78 | HR 68 | Resp 16 | Ht 65.5 in | Wt 214.0 lb

## 2013-11-16 DIAGNOSIS — N3946 Mixed incontinence: Secondary | ICD-10-CM | POA: Diagnosis not present

## 2013-11-16 DIAGNOSIS — Z01419 Encounter for gynecological examination (general) (routine) without abnormal findings: Secondary | ICD-10-CM

## 2013-11-16 DIAGNOSIS — Z1151 Encounter for screening for human papillomavirus (HPV): Secondary | ICD-10-CM | POA: Diagnosis not present

## 2013-11-16 DIAGNOSIS — Z23 Encounter for immunization: Secondary | ICD-10-CM | POA: Diagnosis not present

## 2013-11-16 DIAGNOSIS — N920 Excessive and frequent menstruation with regular cycle: Secondary | ICD-10-CM | POA: Diagnosis not present

## 2013-11-16 DIAGNOSIS — Z124 Encounter for screening for malignant neoplasm of cervix: Secondary | ICD-10-CM

## 2013-11-16 DIAGNOSIS — Z0000 Encounter for general adult medical examination without abnormal findings: Secondary | ICD-10-CM

## 2013-11-16 NOTE — Progress Notes (Signed)
Subjective:    Anne Mason is a 48 y.o. M AA female who presents for an annual exam. She is still having very heavy periods. She tried continuous OCPs but had a problem remembering to take them. She is interested in an ablation. Her second problem today is that of new onset incontinence. This has worsened over the past year. She denies that it is urge incontinence, and she thinks that it occurs with laughing.  The patient is sexually active. GYN screening history: last pap: was normal. The patient wears seatbelts: yes. The patient participates in regular exercise: yes. Has the patient ever been transfused or tattooed?: no. The patient reports that there is not domestic violence in her life.   Menstrual History: OB History    Gravida Para Term Preterm AB TAB SAB Ectopic Multiple Living   2 2 2       2       Menarche age: 57  Patient's last menstrual period was 10/29/2013.    The following portions of the patient's history were reviewed and updated as appropriate: allergies, current medications, past family history, past medical history, past social history, past surgical history and problem list.  Review of Systems A comprehensive review of systems was negative.   Married for 24 years, mother of 2 teens. She would like a flu vaccine today.    Objective:    BP 113/78 mmHg  Pulse 68  Resp 16  Ht 5' 5.5" (1.664 m)  Wt 214 lb (97.07 kg)  BMI 35.06 kg/m2  LMP 10/29/2013  General Appearance:    Alert, cooperative, no distress, appears stated age  Head:    Normocephalic, without obvious abnormality, atraumatic  Eyes:    PERRL, conjunctiva/corneas clear, EOM's intact, fundi    benign, both eyes  Ears:    Normal TM's and external ear canals, both ears  Nose:   Nares normal, septum midline, mucosa normal, no drainage    or sinus tenderness  Throat:   Lips, mucosa, and tongue normal; teeth and gums normal  Neck:   Supple, symmetrical, trachea midline, no adenopathy;    thyroid:  no  enlargement/tenderness/nodules; no carotid   bruit or JVD  Back:     Symmetric, no curvature, ROM normal, no CVA tenderness  Lungs:     Clear to auscultation bilaterally, respirations unlabored  Chest Wall:    No tenderness or deformity   Heart:    Regular rate and rhythm, S1 and S2 normal, no murmur, rub   or gallop  Breast Exam:    No tenderness, masses, or nipple abnormality  Abdomen:     Soft, non-tender, bowel sounds active all four quadrants,    no masses, no organomegaly  Genitalia:    Normal female without lesion, discharge or tenderness, + Q-tip test, NSSA, NT, mobile, normal adnexal exam     Extremities:   Extremities normal, atraumatic, no cyanosis or edema  Pulses:   2+ and symmetric all extremities  Skin:   Skin color, texture, turgor normal, no rashes or lesions  Lymph nodes:   Cervical, supraclavicular, and axillary nodes normal  Neurologic:   CNII-XII intact, normal strength, sensation and reflexes    throughout  .    Assessment:    Healthy female exam.   Menorrhagia GSUI   Plan:     Breast self exam technique reviewed and patient encouraged to perform self-exam monthly. Mammogram. Pelvic ultrasound. Thin prep Pap smear. fasting blood work at her convenience   Flu vaccine today  Probable ablation after w/u Plan for mid urethral sling at time of ablation

## 2013-11-17 ENCOUNTER — Ambulatory Visit (INDEPENDENT_AMBULATORY_CARE_PROVIDER_SITE_OTHER): Payer: BC Managed Care – PPO

## 2013-11-17 DIAGNOSIS — D252 Subserosal leiomyoma of uterus: Secondary | ICD-10-CM

## 2013-11-17 DIAGNOSIS — N920 Excessive and frequent menstruation with regular cycle: Secondary | ICD-10-CM

## 2013-11-18 LAB — COMPREHENSIVE METABOLIC PANEL
ALT: 18 U/L (ref 0–35)
AST: 17 U/L (ref 0–37)
Albumin: 4 g/dL (ref 3.5–5.2)
Alkaline Phosphatase: 61 U/L (ref 39–117)
BUN: 15 mg/dL (ref 6–23)
CALCIUM: 8.8 mg/dL (ref 8.4–10.5)
CO2: 24 meq/L (ref 19–32)
CREATININE: 0.89 mg/dL (ref 0.50–1.10)
Chloride: 104 mEq/L (ref 96–112)
Glucose, Bld: 81 mg/dL (ref 70–99)
Potassium: 4.7 mEq/L (ref 3.5–5.3)
Sodium: 135 mEq/L (ref 135–145)
Total Bilirubin: 0.3 mg/dL (ref 0.2–1.2)
Total Protein: 7.2 g/dL (ref 6.0–8.3)

## 2013-11-18 LAB — CBC
HEMATOCRIT: 35.7 % — AB (ref 36.0–46.0)
Hemoglobin: 11.3 g/dL — ABNORMAL LOW (ref 12.0–15.0)
MCH: 25.1 pg — ABNORMAL LOW (ref 26.0–34.0)
MCHC: 31.7 g/dL (ref 30.0–36.0)
MCV: 79.2 fL (ref 78.0–100.0)
Platelets: 235 10*3/uL (ref 150–400)
RBC: 4.51 MIL/uL (ref 3.87–5.11)
RDW: 16.7 % — ABNORMAL HIGH (ref 11.5–15.5)
WBC: 8.6 10*3/uL (ref 4.0–10.5)

## 2013-11-18 LAB — LIPID PANEL
Cholesterol: 222 mg/dL — ABNORMAL HIGH (ref 0–200)
HDL: 69 mg/dL (ref >39)
LDL CALC: 129 mg/dL — AB (ref 0–99)
TRIGLYCERIDES: 118 mg/dL (ref <150)
Total CHOL/HDL Ratio: 3.2 Ratio
VLDL: 24 mg/dL (ref 0–40)

## 2013-11-18 LAB — TSH: TSH: 1.371 u[IU]/mL (ref 0.350–4.500)

## 2013-11-18 LAB — VITAMIN D 25 HYDROXY (VIT D DEFICIENCY, FRACTURES): Vit D, 25-Hydroxy: 25 ng/mL — ABNORMAL LOW (ref 30–89)

## 2013-11-19 ENCOUNTER — Telehealth: Payer: Self-pay | Admitting: *Deleted

## 2013-11-19 LAB — CYTOLOGY - PAP

## 2013-11-19 NOTE — Telephone Encounter (Signed)
A copy of labs mailed to pt's home address with recommendations to start a low-fat diet and increase in exercise.  If she has a PCP to f/u with them.

## 2013-11-19 NOTE — Telephone Encounter (Signed)
Called pt to adv u/s show uterine fibroids and thickened enodometrium - needs appt to follow up and discuss Tx plan. Also adv pt to pick up script for VitD at pharm. Pt expressed understanding and will schedule f/u appt.

## 2013-11-19 NOTE — Telephone Encounter (Signed)
-----   Message from Anne Filbert, MD sent at 11/19/2013  1:06 PM EST ----- Her Vit D is low and she will need weekly po Vit D for 6 weeks and then a repeat Vit D level in 2 months. Thanks

## 2013-11-25 ENCOUNTER — Encounter: Payer: Self-pay | Admitting: Obstetrics & Gynecology

## 2013-11-25 ENCOUNTER — Ambulatory Visit (INDEPENDENT_AMBULATORY_CARE_PROVIDER_SITE_OTHER): Payer: BC Managed Care – PPO | Admitting: Obstetrics & Gynecology

## 2013-11-25 VITALS — BP 112/74 | HR 80 | Resp 16

## 2013-11-25 DIAGNOSIS — N3946 Mixed incontinence: Secondary | ICD-10-CM | POA: Diagnosis not present

## 2013-11-25 DIAGNOSIS — N920 Excessive and frequent menstruation with regular cycle: Secondary | ICD-10-CM

## 2013-11-25 DIAGNOSIS — Z0000 Encounter for general adult medical examination without abnormal findings: Secondary | ICD-10-CM

## 2013-11-25 NOTE — Progress Notes (Signed)
   Subjective:    Patient ID: Anne Mason, female    DOB: [birth year], 48 y.o.   MRN: [id number]  HPI She is here today to discuss her u/s results. She has menorrhagia. U/S showed 3 small subserosal fibroids but no other pathology.   Review of Systems Husband has had a vasectomy.    Objective:   Physical Exam        Assessment & Plan:  GSUI- After her research, she prefers not to have a sling at this time. I have offered a urology consult. Menorrhagia- plan for d&c and Novasure ablation

## 2013-11-26 ENCOUNTER — Ambulatory Visit: Payer: BC Managed Care – PPO | Admitting: Obstetrics & Gynecology

## 2013-12-23 ENCOUNTER — Ambulatory Visit (HOSPITAL_COMMUNITY)
Admission: RE | Admit: 2013-12-23 | Discharge: 2013-12-23 | Disposition: A | Discharge status: Home / Self Care | Payer: BC Managed Care – PPO | Source: Ambulatory Visit | Attending: Obstetrics & Gynecology | Admitting: Obstetrics & Gynecology

## 2013-12-23 DIAGNOSIS — Z0000 Encounter for general adult medical examination without abnormal findings: Secondary | ICD-10-CM

## 2013-12-23 DIAGNOSIS — Z1231 Encounter for screening mammogram for malignant neoplasm of breast: Secondary | ICD-10-CM | POA: Insufficient documentation

## 2013-12-31 ENCOUNTER — Encounter (HOSPITAL_COMMUNITY): Payer: Self-pay | Admitting: *Deleted

## 2014-01-12 NOTE — Anesthesia Preprocedure Evaluation (Addendum)
Anesthesia Evaluation  Patient identified by MRN, date of birth, ID band Patient awake    Reviewed: Allergy & Precautions, H&P , NPO status , Patient's Chart, lab work & pertinent test results  History of Anesthesia Complications Negative for: history of anesthetic complications  Airway Mallampati: II  TM Distance: >3 FB Neck ROM: Full    Dental no notable dental hx. (+) Dental Advisory Given   Pulmonary neg pulmonary ROS,  breath sounds clear to auscultation  Pulmonary exam normal       Cardiovascular negative cardio ROS  Rhythm:Regular Rate:Normal     Neuro/Psych negative neurological ROS  negative psych ROS   GI/Hepatic negative GI ROS, Neg liver ROS, GERD-  Medicated and Controlled,  Endo/Other  obesity  Renal/GU negative Renal ROS  negative genitourinary   Musculoskeletal negative musculoskeletal ROS (+)   Abdominal   Peds negative pediatric ROS (+)  Hematology negative hematology ROS (+)   Anesthesia Other Findings   Reproductive/Obstetrics negative OB ROS                             Anesthesia Physical Anesthesia Plan  ASA: II  Anesthesia Plan: MAC   Post-op Pain Management:    Induction: Intravenous  Airway Management Planned:   Additional Equipment:   Intra-op Plan:   Post-operative Plan: Extubation in OR  Informed Consent: I have reviewed the patients History and Physical, chart, labs and discussed the procedure including the risks, benefits and alternatives for the proposed anesthesia with the patient or authorized representative who has indicated his/her understanding and acceptance.   Dental advisory given  Plan Discussed with: CRNA  Anesthesia Plan Comments:        Anesthesia Quick Evaluation

## 2014-01-13 ENCOUNTER — Ambulatory Visit (HOSPITAL_COMMUNITY): Payer: BC Managed Care – PPO | Admitting: Anesthesiology

## 2014-01-13 ENCOUNTER — Encounter (HOSPITAL_COMMUNITY)
Admission: RE | Disposition: A | Discharge status: Home / Self Care | Payer: Self-pay | Source: Ambulatory Visit | Attending: Obstetrics & Gynecology

## 2014-01-13 ENCOUNTER — Encounter (HOSPITAL_COMMUNITY): Payer: Self-pay | Admitting: Obstetrics & Gynecology

## 2014-01-13 ENCOUNTER — Ambulatory Visit (HOSPITAL_COMMUNITY)
Admission: RE | Admit: 2014-01-13 | Discharge: 2014-01-13 | Disposition: A | Discharge status: Home / Self Care | Payer: BC Managed Care – PPO | Source: Ambulatory Visit | Attending: Obstetrics & Gynecology | Admitting: Obstetrics & Gynecology

## 2014-01-13 DIAGNOSIS — E669 Obesity, unspecified: Secondary | ICD-10-CM | POA: Insufficient documentation

## 2014-01-13 DIAGNOSIS — D259 Leiomyoma of uterus, unspecified: Secondary | ICD-10-CM

## 2014-01-13 DIAGNOSIS — K219 Gastro-esophageal reflux disease without esophagitis: Secondary | ICD-10-CM | POA: Insufficient documentation

## 2014-01-13 DIAGNOSIS — D649 Anemia, unspecified: Secondary | ICD-10-CM | POA: Insufficient documentation

## 2014-01-13 DIAGNOSIS — Z6830 Body mass index (BMI) 30.0-30.9, adult: Secondary | ICD-10-CM | POA: Diagnosis not present

## 2014-01-13 DIAGNOSIS — D500 Iron deficiency anemia secondary to blood loss (chronic): Secondary | ICD-10-CM | POA: Diagnosis not present

## 2014-01-13 DIAGNOSIS — Z79899 Other long term (current) drug therapy: Secondary | ICD-10-CM | POA: Insufficient documentation

## 2014-01-13 DIAGNOSIS — N920 Excessive and frequent menstruation with regular cycle: Secondary | ICD-10-CM | POA: Diagnosis not present

## 2014-01-13 HISTORY — DX: Herpesviral infection, unspecified: B00.9

## 2014-01-13 HISTORY — PX: DILITATION & CURRETTAGE/HYSTROSCOPY WITH NOVASURE ABLATION: SHX5568

## 2014-01-13 LAB — CBC
HEMATOCRIT: 36.5 % (ref 36.0–46.0)
HEMOGLOBIN: 11.5 g/dL — AB (ref 12.0–15.0)
MCH: 25.8 pg — ABNORMAL LOW (ref 26.0–34.0)
MCHC: 31.5 g/dL (ref 30.0–36.0)
MCV: 82 fL (ref 78.0–100.0)
Platelets: 230 10*3/uL (ref 150–400)
RBC: 4.45 MIL/uL (ref 3.87–5.11)
RDW: 15.6 % — ABNORMAL HIGH (ref 11.5–15.5)
WBC: 9.2 10*3/uL (ref 4.0–10.5)

## 2014-01-13 LAB — PREGNANCY, URINE: PREG TEST UR: NEGATIVE

## 2014-01-13 SURGERY — DILATATION & CURETTAGE/HYSTEROSCOPY WITH NOVASURE ABLATION
Anesthesia: Monitor Anesthesia Care | Site: Vagina

## 2014-01-13 MED ORDER — OXYCODONE-ACETAMINOPHEN 5-325 MG PO TABS
1.0000 | ORAL_TABLET | Freq: Once | ORAL | Status: AC
  Administered 2014-01-13: 1 via ORAL

## 2014-01-13 MED ORDER — ACETAMINOPHEN 325 MG PO TABS: 325.0000 mg | ORAL_TABLET | ORAL | Status: DC | PRN

## 2014-01-13 MED ORDER — OXYCODONE-ACETAMINOPHEN 5-325 MG PO TABS
ORAL_TABLET | ORAL | Status: DC
Start: 2014-01-13 — End: 2014-01-13
  Filled 2014-01-13: qty 1

## 2014-01-13 MED ORDER — LACTATED RINGERS IV SOLN
INTRAVENOUS | Status: DC
  Administered 2014-01-13: 14:00:00 via INTRAVENOUS

## 2014-01-13 MED ORDER — IBUPROFEN 600 MG PO TABS: 600.0000 mg | ORAL_TABLET | Freq: Four times a day (QID) | ORAL | Status: DC | PRN

## 2014-01-13 MED ORDER — MIDAZOLAM HCL 2 MG/2ML IJ SOLN
INTRAMUSCULAR | Status: DC | PRN
  Administered 2014-01-13: 2 mg via INTRAVENOUS

## 2014-01-13 MED ORDER — FENTANYL CITRATE 0.05 MG/ML IJ SOLN
INTRAMUSCULAR | Status: AC
  Filled 2014-01-13: qty 2

## 2014-01-13 MED ORDER — BUPIVACAINE HCL 0.5 % IJ SOLN
INTRAMUSCULAR | Status: DC | PRN
  Administered 2014-01-13: 30 mL

## 2014-01-13 MED ORDER — DOXYCYCLINE HYCLATE 100 MG IV SOLR
200.0000 mg | Freq: Once | INTRAVENOUS | Status: AC
  Administered 2014-01-13: 200 mg via INTRAVENOUS
  Filled 2014-01-13 (×2): qty 200

## 2014-01-13 MED ORDER — FENTANYL CITRATE 0.05 MG/ML IJ SOLN: 25.0000 ug | INTRAMUSCULAR | Status: DC | PRN

## 2014-01-13 MED ORDER — DEXAMETHASONE SODIUM PHOSPHATE 10 MG/ML IJ SOLN
INTRAMUSCULAR | Status: DC | PRN
  Administered 2014-01-13: 10 mg via INTRAVENOUS

## 2014-01-13 MED ORDER — LIDOCAINE HCL (CARDIAC) 20 MG/ML IV SOLN
INTRAVENOUS | Status: DC | PRN
  Administered 2014-01-13: 60 mg via INTRAVENOUS

## 2014-01-13 MED ORDER — SCOPOLAMINE 1 MG/3DAYS TD PT72
MEDICATED_PATCH | TRANSDERMAL | Status: AC
  Administered 2014-01-13: 1.5 mg via TRANSDERMAL
  Filled 2014-01-13: qty 1

## 2014-01-13 MED ORDER — PROPOFOL 10 MG/ML IV EMUL
INTRAVENOUS | Status: DC | PRN
  Administered 2014-01-13: 200 mg via INTRAVENOUS

## 2014-01-13 MED ORDER — MIDAZOLAM HCL 2 MG/2ML IJ SOLN
INTRAMUSCULAR | Status: AC
  Filled 2014-01-13: qty 2

## 2014-01-13 MED ORDER — BUPIVACAINE HCL (PF) 0.5 % IJ SOLN
INTRAMUSCULAR | Status: AC
  Filled 2014-01-13: qty 30

## 2014-01-13 MED ORDER — ACETAMINOPHEN 160 MG/5ML PO SOLN: 325.0000 mg | ORAL | Status: DC | PRN

## 2014-01-13 MED ORDER — OXYCODONE-ACETAMINOPHEN 5-325 MG PO TABS: 1.0000 | ORAL_TABLET | Freq: Four times a day (QID) | ORAL | Status: DC | PRN

## 2014-01-13 MED ORDER — FENTANYL CITRATE 0.05 MG/ML IJ SOLN
INTRAMUSCULAR | Status: DC | PRN
  Administered 2014-01-13: 100 ug via INTRAVENOUS

## 2014-01-13 MED ORDER — ONDANSETRON HCL 4 MG/2ML IJ SOLN: 4.0000 mg | Freq: Once | INTRAMUSCULAR | Status: DC | PRN

## 2014-01-13 MED ORDER — KETOROLAC TROMETHAMINE 30 MG/ML IJ SOLN
INTRAMUSCULAR | Status: DC | PRN
  Administered 2014-01-13: 30 mg via INTRAVENOUS

## 2014-01-13 MED ORDER — ONDANSETRON HCL 4 MG/2ML IJ SOLN
INTRAMUSCULAR | Status: DC | PRN
  Administered 2014-01-13: 4 mg via INTRAVENOUS

## 2014-01-13 MED ORDER — SCOPOLAMINE 1 MG/3DAYS TD PT72
1.0000 | MEDICATED_PATCH | Freq: Once | TRANSDERMAL | Status: DC
  Administered 2014-01-13: 1.5 mg via TRANSDERMAL

## 2014-01-13 MED ORDER — OXYCODONE-ACETAMINOPHEN 5-325 MG PO TABS
ORAL_TABLET | ORAL | Status: AC
  Filled 2014-01-13: qty 1

## 2014-01-13 MED ORDER — STERILE WATER FOR IRRIGATION IR SOLN
Status: DC | PRN
  Administered 2014-01-13: 1000 mL

## 2014-01-13 SURGICAL SUPPLY — 15 items
ABLATOR ENDOMETRIAL BIPOLAR (ABLATOR) ×3 IMPLANT
CATH ROBINSON RED A/P 16FR (CATHETERS) ×3 IMPLANT
CLOTH BEACON ORANGE TIMEOUT ST (SAFETY) ×3 IMPLANT
GLOVE BIO SURGEON STRL SZ 6.5 (GLOVE) ×2 IMPLANT
GLOVE BIO SURGEONS STRL SZ 6.5 (GLOVE) ×1
GOWN STRL REUS W/TWL LRG LVL3 (GOWN DISPOSABLE) ×9 IMPLANT
NDL SPNL 18GX3.5 QUINCKE PK (NEEDLE) ×1 IMPLANT
NEEDLE SPNL 18GX3.5 QUINCKE PK (NEEDLE) ×3 IMPLANT
PACK VAGINAL MINOR WOMEN LF (CUSTOM PROCEDURE TRAY) ×3 IMPLANT
PAD OB MATERNITY 4.3X12.25 (PERSONAL CARE ITEMS) ×3 IMPLANT
SYR 30ML LL (SYRINGE) ×3 IMPLANT
TOWEL OR 17X24 6PK STRL BLUE (TOWEL DISPOSABLE) ×6 IMPLANT
TUBING AQUILEX INFLOW (TUBING) ×3 IMPLANT
TUBING AQUILEX OUTFLOW (TUBING) ×3 IMPLANT
WATER STERILE IRR 1000ML POUR (IV SOLUTION) ×3 IMPLANT

## 2014-01-13 NOTE — Discharge Instructions (Signed)
Dilation and Curettage or Vacuum Curettage, Care After Refer to this sheet in the next few weeks. These instructions provide you with information on caring for yourself after your procedure. Your health care provider may also give you more specific instructions. Your treatment has been planned according to current medical practices, but problems sometimes occur. Call your health care provider if you have any problems or questions after your procedure. WHAT TO EXPECT AFTER THE PROCEDURE After your procedure, it is typical to have light cramping and bleeding. This may last for 2 days to 2 weeks after the procedure. HOME CARE INSTRUCTIONS   Do not drive for 24 hours.  Wait 1 week before returning to strenuous activities.  Take your temperature 2 times a day for 4 days and write it down. Provide these temperatures to your health care provider if you develop a fever.  Avoid long periods of standing.  Avoid heavy lifting, pushing, or pulling. Do not lift anything heavier than 10 pounds (4.5 kg).  Limit stair climbing to once or twice a day.  Take rest periods often.  You may resume your usual diet.  Drink enough fluids to keep your urine clear or pale yellow.  Your usual bowel function should return. If you have constipation, you may:  Take a mild laxative with permission from your health care provider.  Add fruit and bran to your diet.  Drink more fluids.  Take showers instead of baths until your health care provider gives you permission to take baths.  Do not go swimming or use a hot tub until your health care provider approves.  Try to have someone with you or available to you the first 24-48 hours, especially if you were given a general anesthetic.  Do not douche, use tampons, or have sex (intercourse) for 2 weeks after the procedure.  Only take over-the-counter or prescription medicines as directed by your health care provider. Do not take aspirin. It can cause  bleeding.  Follow up with your health care provider as directed. SEEK MEDICAL CARE IF:   You have increasing cramps or pain that is not relieved with medicine.  You have abdominal pain that does not seem to be related to the same area of earlier cramping and pain.  You have bad smelling vaginal discharge.  You have a rash.  You are having problems with any medicine. SEEK IMMEDIATE MEDICAL CARE IF:   You have bleeding that is heavier than a normal menstrual period.  You have a fever.  You have chest pain.  You have shortness of breath.  You feel dizzy or feel like fainting.  You pass out.  You have pain in your shoulder strap area.  You have heavy vaginal bleeding with or without blood clots. MAKE SURE YOU:   Understand these instructions.  Will watch your condition.  Will get help right away if you are not doing well or get worse. Document Released: 12/30/1999 Document Revised: 01/06/2013 Document Reviewed: 07/31/2012 [facility] Patient Information 2015 [place], [place]. This information is not intended to replace advice given to you by your health care provider. Make sure you discuss any questions you have with your health care provider.  Post Anesthesia Home Care Instructions  Activity: Get plenty of rest for the remainder of the day. A responsible adult should stay with you for 24 hours following the procedure.  For the next 24 hours, DO NOT: -Drive a car -Paediatric nurse -Drink alcoholic beverages -Take any medication unless instructed by your physician -Make  any legal decisions or sign important papers.  Meals: Start with liquid foods such as gelatin or soup. Progress to regular foods as tolerated. Avoid greasy, spicy, heavy foods. If nausea and/or vomiting occur, drink only clear liquids until the nausea and/or vomiting subsides. Call your physician if vomiting continues.  Special Instructions/Symptoms: Your throat may feel dry or sore from the anesthesia  or the breathing tube placed in your throat during surgery. If this causes discomfort, gargle with warm salt water. The discomfort should disappear within 24 hours.

## 2014-01-13 NOTE — H&P (Signed)
Anne Mason is an 47 y.o. female. She has long heavy periods. She is anemic. Her u/s showed small subserosal fibroids. She tried continuous OCPs  Pertinent Gynecological History: Menses: flow is excessive with use of 10 pads or tampons on heaviest days Bleeding: monthly Contraception: none DES exposure: denies Blood transfusions: none Sexually transmitted diseases: no past history Previous GYN Procedures: none  Last mammogram: normal Date: 2015 Last pap: normal Date: 2015    Menstrual History: Menarche age:  Patient's last menstrual period was 12/25/2013 (exact date).    Past Medical History  Diagnosis Date  . GERD (gastroesophageal reflux disease)   . Elevated cholesterol     diet controlled - no meds  . Obesity (BMI 30.0-34.9)   . SVD (spontaneous vaginal delivery)     x 2  . HSV infection     History reviewed. No pertinent past surgical history.  Family History  Problem Relation Age of Onset  . Cancer Maternal Aunt     post menopausal breast   . Cancer Mother     endometrial  . Cancer Father     stomach  . Hypertension Mother   . Diabetes Mother   . Heart disease Father   . Heart disease Mother     Social History:  reports that she has never smoked. She has never used smokeless tobacco. She reports that she drinks alcohol. She reports that she does not use illicit drugs.  Allergies: No Known Allergies  Prescriptions prior to admission  Medication Sig Dispense Refill Last Dose  . ibuprofen (ADVIL,MOTRIN) 200 MG tablet Take 400 mg by mouth every 6 (six) hours as needed for mild pain or cramping.   Past Week at Unknown time  . Multiple Vitamin (MULTIVITAMIN) capsule Take 1 capsule by mouth daily.     Past Week at Unknown time  . omeprazole (PRILOSEC) 20 MG capsule Take 1 capsule (20 mg total) by mouth daily. 31 capsule 12 01/13/2014 at Unknown time  . valACYclovir (VALTREX) 1000 MG tablet Take 1 tablet (1,000 mg total) by mouth daily. (Patient taking  differently: Take 1,000 mg by mouth daily as needed (fever blister outbreak). ) 5 tablet 2 Past Week at Unknown time  . norgestrel-ethinyl estradiol (LO/OVRAL,CRYSELLE) 0.3-30 MG-MCG tablet Take ACTIVE pill daily. She will need to refill her prescription every 3 weeks. 1 Package 16 Taking    ROS  Blood pressure 132/82, pulse 87, temperature 97.9 F (36.6 C), temperature source Oral, resp. rate 18, height 5\' 5"  (1.651 m), weight 86.183 kg (190 lb), last menstrual period 12/25/2013, SpO2 100 %. Physical Exam Heart- rrr Lungs- CTAB Abd- benign No results found for this or any previous visit (from the past 24 hour(s)).  No results found.  Assessment/Plan: Menorrhagia with anemia- plan for d&c, Novasure.  She understands the risks of surgery, including, but not to infection, bleeding, DVTs, damage to bowel, bladder, ureters. She wishes to proceed.     Demetrius Barrell C. 01/13/2014, 2:20 PM

## 2014-01-13 NOTE — Anesthesia Postprocedure Evaluation (Signed)
  Anesthesia Post-op Note  Patient: Anne Mason  Procedure(s) Performed: Procedure(s): DILATATION & CURETTAGE WITH NOVASURE ABLATION (N/A)  Patient Location: PACU  Anesthesia Type:General  Level of Consciousness: awake, alert  and oriented  Airway and Oxygen Therapy: Patient Spontanous Breathing  Post-op Pain: none  Post-op Assessment: Post-op Vital signs reviewed, Patient's Cardiovascular Status Stable, Respiratory Function Stable, Patent Airway, No signs of Nausea or vomiting and Pain level controlled  Post-op Vital Signs: Reviewed and stable  Last Vitals:  Filed Vitals:   01/13/14 1600  BP: 131/84  Pulse: 68  Temp:   Resp: 10    Complications: No apparent anesthesia complications

## 2014-01-13 NOTE — Op Note (Signed)
01/13/2014  3:29 PM  PATIENT:  Anne Mason  48 y.o. female  PRE-OPERATIVE DIAGNOSIS:  cpt - [id number] - Menorrhagia, fibroids, anemia  POST-OPERATIVE DIAGNOSIS:  cpt - 10626 - same  PROCEDURE:  Procedure(s): DILATATION & CURETTAGE WITH NOVASURE ABLATION (N/A)  SURGEON:  Surgeon(s) and Role:     Emily Filbert, MD - Primary   ANESTHESIA:   general  EBL:  Total I/O In: 300 [I.V.:300] Out: 50 [Urine:50]  BLOOD ADMINISTERED:none  DRAINS: none   LOCAL MEDICATIONS USED:  MARCAINE     SPECIMEN:  Source of Specimen:  uterine curettings  DISPOSITION OF SPECIMEN:  PATHOLOGY  COUNTS:  YES  TOURNIQUET:  * No tourniquets in log *  DICTATION: .Dragon Dictation  PLAN OF CARE: Discharge to home after PACU  PATIENT DISPOSITION:  PACU - hemodynamically stable.   Delay start of Pharmacological VTE agent (>24hrs) due to surgical blood loss or risk of bleeding: not applicable    The risks, benefits, and alternatives of surgery were explained, understood, and accepted. All questions were answered. Consents were signed. In the operating room LMA anesthesia was applied without complication, and she was placed in the dorsal lithotomy position. Her vagina was prepped and draped in the usual sterile fashion. A Robinson catheter was used to drain her bladder. A bimanual exam revealed a 10 week size , anteverted mobile uterus. Her adnexa were nonenlarged. A speculum was placed and a single-tooth tenaculum was used to grasp the anterior lip of her cervix. A total of 30 mL of 0.5% Marcaine was used to perform a paracervical block. Her uterus sounded to 12 cm. Her cervix was carefully and slowly dilated to accommodate a small curette. A curettage was done in all quadrants and the fundus of the uterus. A large amount of polypoid-type  tissue was obtained. A gritty sensation was appreciated throughout.    Her cervical length measured 6cm. This gives her uterine cavity length of 6 cm. The cervix was gently  dilated with Hegar dilators to accommodate the NovaSure device. The arms of the device was deployed and the uterine cavity width measured 4.0 cm. The device passed its test. An it ran for 1 minute, 45  seconds. I removed the tenaculum and no bleeding was noted. The NovaSure device was removed and no bleeding was noted from the endocervix. She was extubated and taken to the recovery room in stable condition. She tolerated the procedure well.

## 2014-01-13 NOTE — Transfer of Care (Signed)
Immediate Anesthesia Transfer of Care Note  Patient: Anne Mason  Procedure(s) Performed: Procedure(s): DILATATION & CURETTAGE WITH NOVASURE ABLATION (N/A)  Patient Location: PACU  Anesthesia Type:General  Level of Consciousness: awake  Airway & Oxygen Therapy: Patient Spontanous Breathing and Patient connected to nasal cannula oxygen  Post-op Assessment: Report given to PACU RN and Post -op Vital signs reviewed and stable  Post vital signs: stable  Complications: No apparent anesthesia complications

## 2014-01-14 ENCOUNTER — Encounter (HOSPITAL_COMMUNITY): Payer: Self-pay | Admitting: Obstetrics & Gynecology

## 2014-02-01 ENCOUNTER — Other Ambulatory Visit: Payer: Self-pay | Admitting: *Deleted

## 2014-02-01 DIAGNOSIS — B009 Herpesviral infection, unspecified: Secondary | ICD-10-CM

## 2014-02-01 MED ORDER — VALACYCLOVIR HCL 1 G PO TABS: 1000.0000 mg | ORAL_TABLET | Freq: Every day | ORAL | Status: DC

## 2014-02-01 NOTE — Telephone Encounter (Signed)
RX sent to [facility] for Valtrex

## 2014-02-02 ENCOUNTER — Other Ambulatory Visit: Payer: Self-pay | Admitting: Obstetrics & Gynecology

## 2014-03-03 ENCOUNTER — Encounter: Payer: Self-pay | Admitting: Obstetrics & Gynecology

## 2014-03-03 ENCOUNTER — Ambulatory Visit (INDEPENDENT_AMBULATORY_CARE_PROVIDER_SITE_OTHER): Payer: 59 | Admitting: Obstetrics & Gynecology

## 2014-03-03 VITALS — BP 119/79 | HR 77 | Resp 16 | Ht 65.0 in | Wt 213.0 lb

## 2014-03-03 DIAGNOSIS — Z9889 Other specified postprocedural states: Secondary | ICD-10-CM

## 2014-03-03 DIAGNOSIS — N924 Excessive bleeding in the premenopausal period: Secondary | ICD-10-CM

## 2014-03-03 NOTE — Progress Notes (Signed)
   Subjective:    Patient ID: Anne Mason, female    DOB: Oct 24, 1965, 49 y.o.   MRN: [id number]  HPI  Anne Mason is here 6 weeks post op d/s d&c and Novasure. She had a period about 2 weeks ago that was just like her pre op periods- heavy.  Review of Systems     Objective:   Physical Exam Cervix- appears normal Bimanual -NSSA, NT, no adnexal masses, NT Breathing normally Neuro intact Abd- obese, benign       Assessment & Plan:  1 heavy period since Novasure We will follow her subsequent periods. If they are still heavy, she will come by for a CBC and then an appt with me.

## 2014-10-21 ENCOUNTER — Telehealth: Payer: Self-pay | Admitting: *Deleted

## 2014-10-22 ENCOUNTER — Telehealth: Payer: Self-pay | Admitting: *Deleted

## 2014-10-22 DIAGNOSIS — B009 Herpesviral infection, unspecified: Secondary | ICD-10-CM

## 2014-10-22 MED ORDER — ACYCLOVIR 400 MG PO TABS: ORAL_TABLET | ORAL | Status: DC

## 2014-10-22 NOTE — Telephone Encounter (Signed)
Pt called wanting to change from Valtrex to Acyclovir due to insurance requirements.

## 2014-10-22 NOTE — Telephone Encounter (Signed)
error 

## 2014-12-20 ENCOUNTER — Other Ambulatory Visit: Payer: Self-pay

## 2014-12-20 DIAGNOSIS — Z1231 Encounter for screening mammogram for malignant neoplasm of breast: Secondary | ICD-10-CM

## 2015-01-06 ENCOUNTER — Ambulatory Visit
Admission: RE | Admit: 2015-01-06 | Discharge: 2015-01-06 | Disposition: A | Discharge status: Home / Self Care | Payer: 59 | Source: Ambulatory Visit

## 2015-01-06 DIAGNOSIS — Z1231 Encounter for screening mammogram for malignant neoplasm of breast: Secondary | ICD-10-CM

## 2015-01-28 ENCOUNTER — Other Ambulatory Visit: Payer: Self-pay | Admitting: Obstetrics & Gynecology

## 2015-07-25 ENCOUNTER — Ambulatory Visit: Payer: 59 | Admitting: Obstetrics & Gynecology

## 2016-05-16 ENCOUNTER — Other Ambulatory Visit (HOSPITAL_COMMUNITY): Payer: Self-pay | Admitting: Obstetrics & Gynecology

## 2016-05-16 DIAGNOSIS — Z1231 Encounter for screening mammogram for malignant neoplasm of breast: Secondary | ICD-10-CM

## 2016-06-05 ENCOUNTER — Inpatient Hospital Stay: Admission: RE | Admit: 2016-06-05 | Payer: 59 | Source: Ambulatory Visit

## 2016-07-06 ENCOUNTER — Encounter: Payer: Self-pay | Admitting: Emergency Medicine

## 2016-07-06 ENCOUNTER — Emergency Department (INDEPENDENT_AMBULATORY_CARE_PROVIDER_SITE_OTHER)
Admission: EM | Admit: 2016-07-06 | Discharge: 2016-07-06 | Disposition: A | Discharge status: Home / Self Care | Payer: 59 | Source: Home / Self Care | Attending: Family Medicine | Admitting: Family Medicine

## 2016-07-06 ENCOUNTER — Emergency Department (INDEPENDENT_AMBULATORY_CARE_PROVIDER_SITE_OTHER): Payer: 59

## 2016-07-06 DIAGNOSIS — S8002XA Contusion of left knee, initial encounter: Secondary | ICD-10-CM | POA: Diagnosis not present

## 2016-07-06 DIAGNOSIS — M25562 Pain in left knee: Secondary | ICD-10-CM

## 2016-07-06 NOTE — ED Provider Notes (Signed)
[name]    CSN: [id number] Arrival date & time: 07/06/16  1936     History   Chief Complaint Chief Complaint  Patient presents with  . Knee Pain    HPI Anne Mason is a 51 y.o. female.   About 8 hours ago patient tripped in [place], landing on her anterior left knee.  She has had persistent mild anterior knee pain.  No sensation of instabilitiy.   The history is provided by the patient.  Knee Pain  Location:  Knee Injury: yes   Mechanism of injury: fall   Fall:    Fall occurred: in a store.   Impact surface:  Hard floor   Point of impact: left knee. Knee location:  L knee Pain details:    Quality:  Aching   Radiates to:  Does not radiate   Severity:  Mild   Onset quality:  Sudden   Duration:  8 hours   Timing:  Constant   Progression:  Unchanged Chronicity:  New Dislocation: no   Prior injury to area:  Yes Worsened by:  Bearing weight Ineffective treatments:  Ice Associated symptoms: decreased ROM and stiffness   Associated symptoms: no back pain, no muscle weakness, no numbness, no swelling and no tingling   Risk factors: obesity     Past Medical History:  Diagnosis Date  . Elevated cholesterol    diet controlled - no meds  . GERD (gastroesophageal reflux disease)   . HSV infection   . Obesity (BMI 30.0-34.9)   . SVD (spontaneous vaginal delivery)    x 2    Patient Active Problem List   Diagnosis Date Noted  . Preventive measure 10/26/2011  . GERD (gastroesophageal reflux disease) 10/26/2011  . OBESITY, UNSPECIFIED 12/21/2008    Past Surgical History:  Procedure Laterality Date  . DILITATION & CURRETTAGE/HYSTROSCOPY WITH NOVASURE ABLATION N/A 01/13/2014   Procedure: DILATATION & CURETTAGE WITH NOVASURE ABLATION;  Surgeon: Emily Filbert, MD;  Location: [facility];  Service: Gynecology;  Laterality: N/A;    OB History    Gravida Para Term Preterm AB Living   2 2 2     2    SAB TAB Ectopic Multiple Live Births                    Home Medications    Prior to Admission medications   Medication Sig Start Date End Date Taking? Authorizing Provider  omeprazole (PRILOSEC) 20 MG capsule TAKE 1 CAPSULE (20 MG TOTAL) BY MOUTH DAILY. 02/09/15  Yes Emily Filbert, MD    Family History Family History  Problem Relation Age of Onset  . Cancer Maternal Aunt        post menopausal breast   . Cancer Mother        endometrial  . Hypertension Mother   . Diabetes Mother   . Heart disease Mother   . Cancer Father        stomach  . Heart disease Father     Social History Social History  Substance Use Topics  . Smoking status: Never Smoker  . Smokeless tobacco: Never Used  . Alcohol use No     Comment: 1 glass of wine per month     Allergies   Patient has no known allergies.   Review of Systems Review of Systems  Musculoskeletal: Positive for stiffness. Negative for back pain.  All other systems reviewed and are negative.    Physical Exam Triage Vital Signs ED  Triage Vitals  Enc Vitals Group     BP 07/06/16 1955 116/78     Pulse Rate 07/06/16 1955 74     Resp --      Temp 07/06/16 1955 98.4 F (36.9 C)     Temp Source 07/06/16 1955 Oral     SpO2 07/06/16 1955 97 %     Weight 07/06/16 1956 190 lb (86.2 kg)     Height 07/06/16 1956 5\' 5"  (1.651 m)     Head Circumference --      Peak Flow --      Pain Score 07/06/16 1956 6     Pain Loc --      Pain Edu? --      Excl. in [place]? --    No data found.   Updated Vital Signs BP 116/78 (BP Location: Left Arm)   Pulse 74   Temp 98.4 F (36.9 C) (Oral)   Ht 5\' 5"  (1.651 m)   Wt 190 lb (86.2 kg)   LMP 06/29/2016   SpO2 97%   BMI 31.62 kg/m   Visual Acuity Right Eye Distance:   Left Eye Distance:   Bilateral Distance:    Right Eye Near:   Left Eye Near:    Bilateral Near:     Physical Exam  Constitutional: She appears well-developed and well-nourished. No distress.  HENT:  Head: Atraumatic.  Eyes: Pupils are equal, round, and reactive  to light.  Neck: Normal range of motion.  Cardiovascular: Normal rate.   Pulmonary/Chest: Effort normal.  Musculoskeletal:       Left knee: She exhibits normal range of motion, no swelling, no effusion, no ecchymosis, no deformity, no laceration, no erythema, no LCL laxity and normal patellar mobility. Tenderness found. Patellar tendon tenderness noted.       Legs: Left knee:  No effusion, erythema, or warmth.  Knee stable, negative drawer test.  McMurray test negative.  Mild tenderness to palpation over lower pole of patella at insertion of patellar tendon.  Neurological: She is alert.  Skin: Skin is warm and dry.  Nursing note and vitals reviewed.    UC Treatments / Results  Labs (all labs ordered are listed, but only abnormal results are displayed) Labs Reviewed - No data to display  EKG  EKG Interpretation None       Radiology Dg Knee Complete 4 Views Left  Result Date: 07/06/2016 CLINICAL DATA:  Left knee pain secondary to a fall today. Painful range of motion. EXAM: LEFT KNEE - COMPLETE 4+ VIEW COMPARISON:  11/07/2009 FINDINGS: No evidence of fracture, dislocation, or joint effusion. No evidence of arthropathy or other focal bone abnormality. Soft tissues are unremarkable. IMPRESSION: Negative. Electronically Signed   By: Lorriane Shire M.D.   On: 07/06/2016 20:15    Procedures Procedures (including critical care time)  Medications Ordered in UC Medications - No data to display   Initial Impression / Assessment and Plan / UC Course  I have reviewed the triage vital signs and the nursing notes.  Pertinent labs & imaging results that were available during my care of the patient were reviewed by me and considered in my medical decision making (see chart for details).    Ace wrap applied. Apply ice pack for 30 minutes every 1 to 2 hours today and tomorrow.  Wear Ace wrap until swelling decreases.  May take Ibuprofen 200mg , 4 tabs every 8 hours with food.  Followup  with Dr. Aundria Mems or Dr. Lynne [facility] ([facility]) if not improving about two weeks.     Final Clinical Impressions(s) / UC Diagnoses   Final diagnoses:  Contusion of left knee, initial encounter    New Prescriptions New Prescriptions   No medications on file     Kandra Nicolas, MD 07/08/16 [id number]

## 2016-07-06 NOTE — ED Triage Notes (Signed)
Patient presents to [facility] with complaint of Left Knee Pain. Patient states she fell in [place].

## 2016-07-06 NOTE — Discharge Instructions (Signed)
Apply ice pack for 30 minutes every 1 to 2 hours today and tomorrow.  Wear Ace wrap until swelling decreases.  May take Ibuprofen 200mg , 4 tabs every 8 hours with food.

## 2016-07-30 ENCOUNTER — Ambulatory Visit
Admission: RE | Admit: 2016-07-30 | Discharge: 2016-07-30 | Disposition: A | Discharge status: Home / Self Care | Payer: 59 | Source: Ambulatory Visit | Attending: Obstetrics & Gynecology | Admitting: Obstetrics & Gynecology

## 2016-07-30 DIAGNOSIS — Z1231 Encounter for screening mammogram for malignant neoplasm of breast: Secondary | ICD-10-CM

## 2017-05-15 ENCOUNTER — Ambulatory Visit: Payer: 59 | Admitting: Obstetrics & Gynecology

## 2017-05-23 ENCOUNTER — Encounter: Payer: Self-pay | Admitting: Obstetrics & Gynecology

## 2017-05-23 ENCOUNTER — Ambulatory Visit (INDEPENDENT_AMBULATORY_CARE_PROVIDER_SITE_OTHER): Payer: 59 | Admitting: Obstetrics & Gynecology

## 2017-05-23 VITALS — BP 114/77 | HR 68 | Ht 65.0 in | Wt 215.0 lb

## 2017-05-23 DIAGNOSIS — N39498 Other specified urinary incontinence: Secondary | ICD-10-CM

## 2017-05-23 DIAGNOSIS — Z01419 Encounter for gynecological examination (general) (routine) without abnormal findings: Secondary | ICD-10-CM

## 2017-05-23 DIAGNOSIS — N920 Excessive and frequent menstruation with regular cycle: Secondary | ICD-10-CM | POA: Diagnosis not present

## 2017-05-23 DIAGNOSIS — Z124 Encounter for screening for malignant neoplasm of cervix: Secondary | ICD-10-CM | POA: Diagnosis not present

## 2017-05-23 DIAGNOSIS — Z1211 Encounter for screening for malignant neoplasm of colon: Secondary | ICD-10-CM | POA: Diagnosis not present

## 2017-05-23 DIAGNOSIS — Z1151 Encounter for screening for human papillomavirus (HPV): Secondary | ICD-10-CM | POA: Diagnosis not present

## 2017-05-23 NOTE — Progress Notes (Signed)
Subjective:    Anne Mason is a 52 y.o. married P2 (29 and 41 yo kids) female who presents for an annual exam. The patient has no complaints today. The patient is sexually active. GYN screening history: last pap: was normal. The patient wears seatbelts: yes. The patient participates in regular exercise: yes. Has the patient ever been transfused or tattooed?: no. The patient reports that there is not domestic violence in her life.   Menstrual History: OB History    Gravida  2   Para  2   Term  2   Preterm      AB      Living  2     SAB      TAB      Ectopic      Multiple      Live Births              Menarche age: 1 Patient's last menstrual period was 05/15/2017.    The following portions of the patient's history were reviewed and updated as appropriate: allergies, current medications, past family history, past medical history, past social history, past surgical history and problem list.  Review of Systems Pertinent items are noted in HPI.   FH- + breast- maternal aunt (diagnosed post menopause), + endometrial cancer in her mom, no colon cancer Married for 27 years [place] all over [place], [place] Fasting labs done at [facility] when she did a weight loss study   Objective:    BP 114/77   Pulse 68   Ht 5\' 5"  (1.651 m)   Wt 215 lb (97.5 kg)   LMP 05/15/2017   BMI 35.78 kg/m   General Appearance:    Alert, cooperative, no distress, appears stated age  Head:    Normocephalic, without obvious abnormality, atraumatic  Eyes:    PERRL, conjunctiva/corneas clear, EOM's intact, fundi    benign, both eyes  Ears:    Normal TM's and external ear canals, both ears  Nose:   Nares normal, septum midline, mucosa normal, no drainage    or sinus tenderness  Throat:   Lips, mucosa, and tongue normal; teeth and gums normal  Neck:   Supple, symmetrical, trachea midline, no adenopathy;    thyroid:  no enlargement/tenderness/nodules; no carotid   bruit or JVD  Back:      Symmetric, no curvature, ROM normal, no CVA tenderness  Lungs:     Clear to auscultation bilaterally, respirations unlabored  Chest Wall:    No tenderness or deformity   Heart:    Regular rate and rhythm, S1 and S2 normal, no murmur, rub   or gallop  Breast Exam:    No tenderness, masses, or nipple abnormality  Abdomen:     Soft, non-tender, bowel sounds active all four quadrants,    no masses, no organomegaly  Genitalia:    Normal female without lesion, discharge or tenderness, 10 week size, c/w her known fibroids     Extremities:   Extremities normal, atraumatic, no cyanosis or edema  Pulses:   2+ and symmetric all extremities  Skin:   Skin color, texture, turgor normal, no rashes or lesions  Lymph nodes:   Cervical, supraclavicular, and axillary nodes normal  Neurologic:   CNII-XII intact, normal strength, sensation and reflexes    throughout  .    Assessment:    Healthy female exam.   Urinary incontinence for a year, wears pads, all the time   Plan:     Thin  prep Pap smear. with cotesting Refer to GI for colon screening Urology referral Check CBC

## 2017-05-24 LAB — CBC
HEMATOCRIT: 35.4 % (ref 35.0–45.0)
Hemoglobin: 11.8 g/dL (ref 11.7–15.5)
MCH: 27.3 pg (ref 27.0–33.0)
MCHC: 33.3 g/dL (ref 32.0–36.0)
MCV: 81.8 fL (ref 80.0–100.0)
MPV: 11.7 fL (ref 7.5–12.5)
Platelets: 248 10*3/uL (ref 140–400)
RBC: 4.33 10*6/uL (ref 3.80–5.10)
RDW: 13.7 % (ref 11.0–15.0)
WBC: 8.2 10*3/uL (ref 3.8–10.8)

## 2017-05-28 LAB — CYTOLOGY - PAP
Bacterial vaginitis: NEGATIVE
Candida vaginitis: NEGATIVE
DIAGNOSIS: NEGATIVE
HPV: NOT DETECTED

## 2017-06-24 ENCOUNTER — Encounter: Payer: 59 | Admitting: Advanced Practice Midwife

## 2018-08-28 ENCOUNTER — Other Ambulatory Visit: Payer: Self-pay | Admitting: Obstetrics & Gynecology

## 2018-08-28 DIAGNOSIS — Z1231 Encounter for screening mammogram for malignant neoplasm of breast: Secondary | ICD-10-CM

## 2018-08-29 ENCOUNTER — Other Ambulatory Visit: Payer: Self-pay

## 2018-08-29 ENCOUNTER — Ambulatory Visit
Admission: RE | Admit: 2018-08-29 | Discharge: 2018-08-29 | Disposition: A | Discharge status: Home / Self Care | Payer: 59 | Source: Ambulatory Visit | Attending: Obstetrics & Gynecology | Admitting: Obstetrics & Gynecology

## 2018-08-29 DIAGNOSIS — Z1231 Encounter for screening mammogram for malignant neoplasm of breast: Secondary | ICD-10-CM

## 2018-09-02 ENCOUNTER — Other Ambulatory Visit: Payer: Self-pay | Admitting: Obstetrics & Gynecology

## 2018-09-02 DIAGNOSIS — R928 Other abnormal and inconclusive findings on diagnostic imaging of breast: Secondary | ICD-10-CM

## 2018-09-04 ENCOUNTER — Other Ambulatory Visit: Payer: Self-pay

## 2018-09-04 ENCOUNTER — Ambulatory Visit
Admission: RE | Admit: 2018-09-04 | Discharge: 2018-09-04 | Disposition: A | Discharge status: Home / Self Care | Payer: 59 | Source: Ambulatory Visit | Attending: Obstetrics & Gynecology | Admitting: Obstetrics & Gynecology

## 2018-09-04 ENCOUNTER — Other Ambulatory Visit: Payer: Self-pay | Admitting: Obstetrics & Gynecology

## 2018-09-04 DIAGNOSIS — N6310 Unspecified lump in the right breast, unspecified quadrant: Secondary | ICD-10-CM

## 2018-09-04 DIAGNOSIS — R928 Other abnormal and inconclusive findings on diagnostic imaging of breast: Secondary | ICD-10-CM

## 2018-09-16 ENCOUNTER — Ambulatory Visit (INDEPENDENT_AMBULATORY_CARE_PROVIDER_SITE_OTHER): Payer: 59 | Admitting: Certified Nurse Midwife

## 2018-09-16 ENCOUNTER — Other Ambulatory Visit: Payer: Self-pay

## 2018-09-16 ENCOUNTER — Encounter: Payer: Self-pay | Admitting: Certified Nurse Midwife

## 2018-09-16 VITALS — BP 124/86 | HR 74 | Resp 16 | Ht 65.0 in | Wt 221.0 lb

## 2018-09-16 DIAGNOSIS — Z01419 Encounter for gynecological examination (general) (routine) without abnormal findings: Secondary | ICD-10-CM

## 2018-09-16 DIAGNOSIS — Z124 Encounter for screening for malignant neoplasm of cervix: Secondary | ICD-10-CM | POA: Diagnosis not present

## 2018-09-16 DIAGNOSIS — Z113 Encounter for screening for infections with a predominantly sexual mode of transmission: Secondary | ICD-10-CM

## 2018-09-16 DIAGNOSIS — N393 Stress incontinence (female) (male): Secondary | ICD-10-CM

## 2018-09-16 DIAGNOSIS — Z1151 Encounter for screening for human papillomavirus (HPV): Secondary | ICD-10-CM | POA: Diagnosis not present

## 2018-09-16 NOTE — Progress Notes (Signed)
GYNECOLOGY ANNUAL PREVENTATIVE CARE ENCOUNTER NOTE  History:     Anne Mason is a 53 y.o. G61P2002 female here for a routine annual gynecologic exam.  Current complaints: incontinence with sneezing, hot flashes, mood swings, tender breasts since January. Would like lab work to see if she is "going into menopause."  Denies abnormal vaginal bleeding, discharge, pelvic/abdominal pain, problems with intercourse or other gynecologic concerns.    Gynecologic History Patient's last menstrual period was 09/06/2018. Contraception: none Last Pap: 05/23/2017. Results were: normal with negative HPV Last mammogram: 08/29/18. Results were: abnormal. Breast ultrasound on 09/04/18 impression: Probably benign nodularity in the right breast seen sonographically and mammographically. Fat necrosis is considered most likely. Complicated cysts are possible. Solid masses are considered less likely.  Obstetric History OB History  Gravida Para Term Preterm AB Living  '2 2 2     2  '$ SAB TAB Ectopic Multiple Live Births               # Outcome Date GA Lbr Len/2nd Weight Sex Delivery Anes PTL Lv  2 Term           1 Term             Past Medical History:  Diagnosis Date  . Elevated cholesterol    diet controlled - no meds  . GERD (gastroesophageal reflux disease)   . HSV infection   . Obesity (BMI 30.0-34.9)   . SVD (spontaneous vaginal delivery)    x 2    Past Surgical History:  Procedure Laterality Date  . DILITATION & CURRETTAGE/HYSTROSCOPY WITH NOVASURE ABLATION N/A 01/13/2014   Procedure: DILATATION & CURETTAGE WITH NOVASURE ABLATION;  Surgeon: Emily Filbert, MD;  Location: [facility];  Service: Gynecology;  Laterality: N/A;    Current Outpatient Medications on File Prior to Visit  Medication Sig Dispense Refill  . Multiple Vitamin (MULTIVITAMIN) tablet Take 1 tablet by mouth daily.    Marland Kitchen omeprazole (PRILOSEC) 20 MG capsule TAKE 1 CAPSULE (20 MG TOTAL) BY MOUTH DAILY. (Patient not taking: Reported on  05/23/2017) 31 capsule 6   No current facility-administered medications on file prior to visit.     No Known Allergies  Social History:  reports that she has never smoked. She has never used smokeless tobacco. She reports that she does not drink alcohol or use drugs.  Family History  Problem Relation Age of Onset  . Cancer Maternal Aunt        post menopausal breast   . Breast cancer Maternal Aunt   . Cancer Mother        endometrial  . Hypertension Mother   . Diabetes Mother   . Heart disease Mother   . Cancer Father        stomach  . Heart disease Father     The following portions of the patient's history were reviewed and updated as appropriate: allergies, current medications, past family history, past medical history, past social history, past surgical history and problem list.  Review of Systems Pertinent items noted in HPI and remainder of comprehensive ROS otherwise negative.  Physical Exam:  BP 124/86   Pulse 74   Resp 16   Ht '5\' 5"'$  (1.651 m)   Wt 100.2 kg   LMP 09/06/2018   BMI 36.78 kg/m  CONSTITUTIONAL: Well-developed, well-nourished female in no acute distress.  HENT:  Normocephalic, atraumatic, External right and left ear normal. Oropharynx is clear and moist EYES: Conjunctivae and EOM are normal. Pupils are  equal, round, and reactive to light. No scleral icterus.  NECK: Normal range of motion, supple, no masses.  Normal thyroid.  SKIN: Skin is warm and dry. No rash noted. Not diaphoretic. No erythema. No pallor. MUSCULOSKELETAL: Normal range of motion. No tenderness.  No cyanosis, clubbing, or edema.  2+ distal pulses. NEUROLOGIC: Alert and oriented to person, place, and time. Normal reflexes, muscle tone coordination. No cranial nerve deficit noted. PSYCHIATRIC: Normal mood and affect. Normal behavior. Normal judgment and thought content. CARDIOVASCULAR: Normal heart rate noted, regular rhythm RESPIRATORY: Clear to auscultation bilaterally. Effort and  breath sounds normal, no problems with respiration noted. BREASTS: Symmetric in size. No masses, skin changes, nipple drainage, or lymphadenopathy. ABDOMEN: Soft, normal bowel sounds, no distention noted.  No tenderness, rebound or guarding.  PELVIC: Normal appearing external genitalia without lesions, masses, irritation or excoriation; vaginal mucosa pink and moist; cervix pink without lesions or masses. No abnormal discharge noted.  Pap smear obtained.  Normal uterine size, no other palpable masses, no uterine or adnexal tenderness.   Assessment and Plan:    1. Women's annual routine gynecological examination -Pt here for well woman exam -C/o hot flashes, mood swings, breast tenderness -No abdominal/pelvic pain, abnormal vaginal bleeding or discharge -Would like PAP and lab work -Mammogram up to date -Needs colonoscopy. Will make referral  - Cytology - PAP( [facility]) - TSH - Lipid Profile - CBC - Comp Met (CMET) - Ambulatory referral to Gastroenterology - [facility] - Progesterone  2. Stress incontinence, female -Pt reports leaking urine every time she sneezes or coughs -Discussed pelvic floor exercises including Kegels -Will refer to urology  - Ambulatory referral to Urology   Will follow up results of lab work, pap smear and manage accordingly. Colonoscopy scheduled Routine preventative health maintenance measures emphasized. Please refer to After Visit Summary for other counseling recommendations.     Pt to follow up in 1 year for well woman exam or as needed.

## 2018-09-17 ENCOUNTER — Telehealth: Payer: Self-pay | Admitting: *Deleted

## 2018-09-17 LAB — COMPREHENSIVE METABOLIC PANEL
AG Ratio: 1.2 (calc) (ref 1.0–2.5)
ALT: 13 U/L (ref 6–29)
AST: 15 U/L (ref 10–35)
Albumin: 3.8 g/dL (ref 3.6–5.1)
Alkaline phosphatase (APISO): 58 U/L (ref 37–153)
BUN: 15 mg/dL (ref 7–25)
CO2: 26 mmol/L (ref 20–32)
Calcium: 8.8 mg/dL (ref 8.6–10.4)
Chloride: 107 mmol/L (ref 98–110)
Creat: 0.99 mg/dL (ref 0.50–1.05)
Globulin: 3.2 g/dL (calc) (ref 1.9–3.7)
Glucose, Bld: 96 mg/dL (ref 65–99)
Potassium: 3.9 mmol/L (ref 3.5–5.3)
Sodium: 139 mmol/L (ref 135–146)
Total Bilirubin: 0.3 mg/dL (ref 0.2–1.2)
Total Protein: 7 g/dL (ref 6.1–8.1)

## 2018-09-17 LAB — CBC
HCT: 38.9 % (ref 35.0–45.0)
Hemoglobin: 12.4 g/dL (ref 11.7–15.5)
MCH: 26.6 pg — ABNORMAL LOW (ref 27.0–33.0)
MCHC: 31.9 g/dL — ABNORMAL LOW (ref 32.0–36.0)
MCV: 83.5 fL (ref 80.0–100.0)
MPV: 12 fL (ref 7.5–12.5)
Platelets: 252 10*3/uL (ref 140–400)
RBC: 4.66 10*6/uL (ref 3.80–5.10)
RDW: 13.5 % (ref 11.0–15.0)
WBC: 7.2 10*3/uL (ref 3.8–10.8)

## 2018-09-17 LAB — LIPID PANEL
Cholesterol: 245 mg/dL — ABNORMAL HIGH (ref <200)
HDL: 63 mg/dL (ref >=50)
LDL Cholesterol (Calc): 158 mg/dL (calc) — ABNORMAL HIGH
Non-HDL Cholesterol (Calc): 182 mg/dL (calc) — ABNORMAL HIGH (ref <130)
Total CHOL/HDL Ratio: 3.9 (calc) (ref <5.0)
Triglycerides: 120 mg/dL (ref <150)

## 2018-09-17 LAB — PROGESTERONE: Progesterone: <0.5 ng/mL

## 2018-09-17 LAB — TSH: TSH: 1.19 mIU/L

## 2018-09-17 LAB — FOLLICLE STIMULATING HORMONE: FSH: 8.3 m[IU]/mL

## 2018-09-17 NOTE — Telephone Encounter (Signed)
-----   Message from Lajean Manes, CNM sent at 09/17/2018  9:10 AM EDT ----- Please call patient and notify of results. Patient needs to adjust diet and start exercising as patient has elevated cholesterol levels- patient needs to follow up with PCP for management of cholesterol levels and possible medication management. Please also notify patient that according to her [facility] levels she is not close to menopause at this time. Perimenopause can last for 1-4 years before transitioning to menopause.   Darrol Poke CNM

## 2018-09-17 NOTE — Telephone Encounter (Signed)
Copy of labs with Wende Bushy, CNM recommendations mailed to pt's home address.

## 2018-09-19 LAB — CYTOLOGY - PAP
Chlamydia: NEGATIVE
Diagnosis: NEGATIVE
HPV 16/18/45 genotyping: NEGATIVE
HPV: DETECTED — AB
Neisseria Gonorrhea: NEGATIVE

## 2018-11-04 ENCOUNTER — Encounter: Payer: Self-pay | Admitting: Certified Nurse Midwife

## 2018-11-05 ENCOUNTER — Encounter: Payer: Self-pay | Admitting: Internal Medicine

## 2018-11-18 ENCOUNTER — Ambulatory Visit (INDEPENDENT_AMBULATORY_CARE_PROVIDER_SITE_OTHER): Payer: 59 | Admitting: Internal Medicine

## 2018-11-18 ENCOUNTER — Encounter: Payer: Self-pay | Admitting: Internal Medicine

## 2018-11-18 ENCOUNTER — Other Ambulatory Visit: Payer: Self-pay

## 2018-11-18 VITALS — BP 116/70 | HR 92 | Temp 98.5°F | Ht 65.0 in | Wt 221.2 lb

## 2018-11-18 DIAGNOSIS — Z1211 Encounter for screening for malignant neoplasm of colon: Secondary | ICD-10-CM | POA: Diagnosis not present

## 2018-11-18 DIAGNOSIS — K5901 Slow transit constipation: Secondary | ICD-10-CM

## 2018-11-18 DIAGNOSIS — Z1159 Encounter for screening for other viral diseases: Secondary | ICD-10-CM | POA: Insufficient documentation

## 2018-11-18 DIAGNOSIS — K219 Gastro-esophageal reflux disease without esophagitis: Secondary | ICD-10-CM

## 2018-11-18 MED ORDER — NA SULFATE-K SULFATE-MG SULF 17.5-3.13-1.6 GM/177ML PO SOLN: 1.0000 | Freq: Once | ORAL | 0 refills | Status: AC

## 2018-11-18 NOTE — Patient Instructions (Signed)
You have been scheduled for an endoscopy and colonoscopy. Please follow the written instructions given to you at your visit today. Please pick up your prep supplies at the pharmacy within the next 1-3 days. If you use inhalers (even only as needed), please bring them with you on the day of your procedure.  

## 2018-11-18 NOTE — Progress Notes (Signed)
HISTORY OF PRESENT ILLNESS:  Anne Mason is a pleasant 53 y.o. 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  She presents today regarding chronic GERD and inquires about potential nonmedical therapies such as "surgery".  She also presents regarding routine screening colonoscopy, which she has not had.  First, the patient has had GERD for 17 years.  Typical symptoms include pyrosis and regurgitation.  She has been taking Prilosec OTC 20 mg on demand.  She tells me that when she takes medication regularly she has absolutely no reflux symptoms.  Off medication she may experience symptoms depending upon her diet.  She has gained 20 pounds over the past year or so.  She denies dysphagia.  Apparently had endoscopy, though remotely.  Her only other GI complaint is chronic stable constipation which she manages nicely with fiber and water.  No family history of colon cancer.  Review of outside laboratories from September 16, 2018 finds unremarkable comprehensive metabolic panel.  Normal CBC with hemoglobin 12.4.  Normal TSH at 1.19.  She is on no other medications.  REVIEW OF SYSTEMS:  All non-GI ROS negative unless otherwise stated in the HPI except for urinary leakage  Past Medical History:  Diagnosis Date  . Elevated cholesterol    diet controlled - no meds  . GERD (gastroesophageal reflux disease)   . HSV infection   . Obesity (BMI 30.0-34.9)   . SVD (spontaneous vaginal delivery)    x 2    Past Surgical History:  Procedure Laterality Date  . DILITATION & CURRETTAGE/HYSTROSCOPY WITH NOVASURE ABLATION N/A 01/13/2014   Procedure: DILATATION & CURETTAGE WITH NOVASURE ABLATION;  Surgeon: Emily Filbert, MD;  Location: [facility];  Service: Gynecology;  Laterality: N/A;    Social History Payson Madar  reports that she has never smoked. She has never used smokeless tobacco. She reports current  alcohol use. She reports that she does not use drugs.  family history includes Breast cancer in her maternal aunt; Diabetes in her mother and sister; Endometrial cancer in her mother; Heart disease in her father and mother; Hypertension in her mother; Stomach cancer in her father.  No Known Allergies     PHYSICAL EXAMINATION: Vital signs: BP 116/70 (BP Location: Left Arm, Patient Position: Sitting, Cuff Size: Normal)   Pulse 92   Temp 98.5 F (36.9 C)   Ht 5\' 5"  (1.651 m) Comment: height measured without shoes  Wt 221 lb 4 oz (100.4 kg)   LMP 11/04/2018   BMI 36.82 kg/m   Constitutional: generally well-appearing, no acute distress Psychiatric: alert and oriented x3, cooperative Eyes: extraocular movements intact, anicteric, conjunctiva pink Mouth: oral pharynx moist, no lesions Neck: supple no lymphadenopathy Cardiovascular: heart regular rate and rhythm, no murmur Lungs: clear to auscultation bilaterally Abdomen: soft, obese, nontender, nondistended, no obvious ascites, no peritoneal signs, normal bowel sounds, no organomegaly Rectal: Deferred until colonoscopy Extremities: no clubbing, cyanosis, or lower extremity edema bilaterally Skin: no lesions on visible extremities Neuro: No focal deficits.  Cranial nerves intact  ASSESSMENT:  1.  Chronic GERD without alarm features. 2.  Colon cancer screening.  Baseline risk 3.  Chronic stable constipation.  Likely slow transit.  Managed with fiber and water   PLAN:  1.  Reflux precautions with attention to weight loss 2.  Continue PPI (currently Prilosec OTC 20 mg).  Lowest dose at lowest frequency to control symptoms.  I reviewed with her medication side effects and risks. 3.  Upper endoscopy to review the anatomy.  Particularly if the patient wants to consider alternatives to medication such as TIF.  We discussed TIF and traditional fundoplication surgery in some detail including potential complications and long-term results.The  nature of the procedure, as well as the risks, benefits, and alternatives were carefully and thoroughly reviewed with the patient. Ample time for discussion and questions allowed. The patient understood, was satisfied, and agreed to proceed. 4.  Screening colonoscopy.The nature of the procedure, as well as the risks, benefits, and alternatives were carefully and thoroughly reviewed with the patient. Ample time for discussion and questions allowed. The patient understood, was satisfied, and agreed to proceed. 5.  Continue water and fiber for management of constipation

## 2018-11-19 ENCOUNTER — Encounter: Payer: Self-pay | Admitting: Internal Medicine

## 2018-11-19 LAB — SARS CORONAVIRUS 2 (TAT 6-24 HRS): SARS Coronavirus 2: NEGATIVE

## 2018-11-21 ENCOUNTER — Ambulatory Visit (AMBULATORY_SURGERY_CENTER): Payer: 59 | Admitting: Internal Medicine

## 2018-11-21 ENCOUNTER — Other Ambulatory Visit: Payer: Self-pay

## 2018-11-21 ENCOUNTER — Encounter: Payer: Self-pay | Admitting: Internal Medicine

## 2018-11-21 VITALS — BP 117/72 | HR 69 | Temp 98.9°F | Resp 12 | Ht 65.0 in | Wt 221.0 lb

## 2018-11-21 DIAGNOSIS — K219 Gastro-esophageal reflux disease without esophagitis: Secondary | ICD-10-CM | POA: Diagnosis present

## 2018-11-21 DIAGNOSIS — Z1211 Encounter for screening for malignant neoplasm of colon: Secondary | ICD-10-CM

## 2018-11-21 MED ORDER — SODIUM CHLORIDE 0.9 % IV SOLN: 500.0000 mL | Freq: Once | INTRAVENOUS | Status: DC

## 2018-11-21 NOTE — Op Note (Signed)
[facility] Patient Name: Anne Mason Procedure Date: 11/21/2018 1:23 PM MRN: [id number] Endoscopist: Docia Chuck. Henrene Pastor , MD Age: 53 Referring MD:  Date of Birth: Sep 11, 1965 Gender: Female Account #: [id number] Procedure:                Colonoscopy Indications:              Screening for colorectal malignant neoplasm Medicines:                Monitored Anesthesia Care Procedure:                Pre-Anesthesia Assessment:                           - Prior to the procedure, a History and Physical                            was performed, and patient medications and                            allergies were reviewed. The patient's tolerance of                            previous anesthesia was also reviewed. The risks                            and benefits of the procedure and the sedation                            options and risks were discussed with the patient.                            All questions were answered, and informed consent                            was obtained. Prior Anticoagulants: The patient has                            taken no previous anticoagulant or antiplatelet                            agents. ASA Grade Assessment: II - A patient with                            mild systemic disease. After reviewing the risks                            and benefits, the patient was deemed in                            satisfactory condition to undergo the procedure.                           After obtaining informed consent, the colonoscope  was passed under direct vision. Throughout the                            procedure, the patient's blood pressure, pulse, and                            oxygen saturations were monitored continuously. The                            Colonoscope was introduced through the anus and                            advanced to the the cecum, identified by                            appendiceal orifice and  ileocecal valve. The                            ileocecal valve, appendiceal orifice, and rectum                            were photographed. The quality of the bowel                            preparation was excellent. The colonoscopy was                            performed without difficulty. The patient tolerated                            the procedure well. The bowel preparation used was                            SUPREP via split dose instruction. Scope In: 1:34:15 PM Scope Out: 1:48:48 PM Scope Withdrawal Time: 0 hours 11 minutes 40 seconds  Total Procedure Duration: 0 hours 14 minutes 33 seconds  Findings:                 The entire examined colon appeared normal on direct                            and retroflexion views. Complications:            No immediate complications. Estimated blood loss:                            None. Estimated Blood Loss:     Estimated blood loss: none. Impression:               - The entire examined colon is normal on direct and                            retroflexion views.                           - No specimens collected.  Recommendation:           - Repeat colonoscopy in 10 years for screening                            purposes.                           - Patient has a contact number available for                            emergencies. The signs and symptoms of potential                            delayed complications were discussed with the                            patient. Return to normal activities tomorrow.                            Written discharge instructions were provided to the                            patient.                           - Resume previous diet.                           - Continue present medications. Docia Chuck. Henrene Pastor, MD 11/21/2018 1:57:38 PM This report has been signed electronically.

## 2018-11-21 NOTE — Progress Notes (Signed)
Temp by JB and vitals by CW 

## 2018-11-21 NOTE — Progress Notes (Signed)
A/ox3, pleased with MAC, report to RN 

## 2018-11-21 NOTE — Patient Instructions (Signed)
YOU HAD AN ENDOSCOPIC PROCEDURE TODAY AT THE [facility]:   Refer to the procedure report that was given to you for any specific questions about what was found during the examination.  If the procedure report does not answer your questions, please call your gastroenterologist to clarify.  If you requested that your care partner not be given the details of your procedure findings, then the procedure report has been included in a sealed envelope for you to review at your convenience later.  YOU SHOULD EXPECT: Some feelings of bloating in the abdomen. Passage of more gas than usual.  Walking can help get rid of the air that was put into your GI tract during the procedure and reduce the bloating. If you had a lower endoscopy (such as a colonoscopy or flexible sigmoidoscopy) you may notice spotting of blood in your stool or on the toilet paper. If you underwent a bowel prep for your procedure, you may not have a normal bowel movement for a few days.  Please Note:  You might notice some irritation and congestion in your nose or some drainage.  This is from the oxygen used during your procedure.  There is no need for concern and it should clear up in a day or so.  SYMPTOMS TO REPORT IMMEDIATELY:   Following lower endoscopy (colonoscopy or flexible sigmoidoscopy):  Excessive amounts of blood in the stool  Significant tenderness or worsening of abdominal pains  Swelling of the abdomen that is new, acute  Fever of 100F or higher   Following upper endoscopy (EGD)  Vomiting of blood or coffee ground material  New chest pain or pain under the shoulder blades  Painful or persistently difficult swallowing  New shortness of breath  Fever of 100F or higher  Black, tarry-looking stools  For urgent or emergent issues, a gastroenterologist can be reached at any hour by calling [phone].   DIET:  We do recommend a small meal at first, but then you may proceed to your regular diet.  Drink  plenty of fluids but you should avoid alcoholic beverages for 24 hours.  ACTIVITY:  You should plan to take it easy for the rest of today and you should NOT DRIVE or use heavy machinery until tomorrow (because of the sedation medicines used during the test).    FOLLOW UP: Our staff will call the number listed on your records 48-72 hours following your procedure to check on you and address any questions or concerns that you may have regarding the information given to you following your procedure. If we do not reach you, we will leave a message.  We will attempt to reach you two times.  During this call, we will ask if you have developed any symptoms of COVID 19. If you develop any symptoms (ie: fever, flu-like symptoms, shortness of breath, cough etc.) before then, please call [phone].  If you test positive for Covid 19 in the 2 weeks post procedure, please call and report this information to us.    If any biopsies were taken you will be contacted by phone or by letter within the next 1-3 weeks.  Please call us at [phone] if you have not heard about the biopsies in 3 weeks.    SIGNATURES/CONFIDENTIALITY: You and/or your care partner have signed paperwork which will be entered into your electronic medical record.  These signatures attest to the fact that that the information above on your After Visit Summary has been reviewed and is   understood.  Full responsibility of the confidentiality of this discharge information lies with you and/or your care-partner. 

## 2018-11-21 NOTE — Op Note (Signed)
[place] Patient Name: Anne Mason Procedure Date: 11/21/2018 1:22 PM MRN: [id number] Endoscopist: Docia Chuck. Henrene Pastor , MD Age: 53 Referring MD:  Date of Birth: Oct 18, 1965 Gender: Female Account #: [id number] Procedure:                Upper GI endoscopy Indications:              Esophageal reflux Medicines:                Monitored Anesthesia Care Procedure:                Pre-Anesthesia Assessment:                           - Prior to the procedure, a History and Physical                            was performed, and patient medications and                            allergies were reviewed. The patient's tolerance of                            previous anesthesia was also reviewed. The risks                            and benefits of the procedure and the sedation                            options and risks were discussed with the patient.                            All questions were answered, and informed consent                            was obtained. Prior Anticoagulants: The patient has                            taken no previous anticoagulant or antiplatelet                            agents. ASA Grade Assessment: II - A patient with                            mild systemic disease. After reviewing the risks                            and benefits, the patient was deemed in                            satisfactory condition to undergo the procedure.                           After obtaining informed consent, the endoscope was  passed under direct vision. Throughout the                            procedure, the patient's blood pressure, pulse, and                            oxygen saturations were monitored continuously. The                            Endoscope was introduced through the mouth, and                            advanced to the second part of duodenum. The upper                            GI endoscopy was accomplished without  difficulty.                            The patient tolerated the procedure well. Scope In: Scope Out: Findings:                 The esophagus was normal.                           The stomach was normal. There was a small sliding                            hernia.                           The examined duodenum was normal.                           The cardia and gastric fundus were normal on                            retroflexion. Complications:            No immediate complications. Estimated Blood Loss:     Estimated blood loss: none. Impression:               1. Normal EGD with small sliding hiatal hernia                           2. GERD. Recommendation:           1. Reflux precautions                           2. Resume previous medications                           3. Routine GI office follow-up 1 year. Docia Chuck. Henrene Pastor, MD 11/21/2018 2:00:28 PM This report has been signed electronically.

## 2018-11-25 ENCOUNTER — Telehealth: Payer: Self-pay

## 2018-11-25 NOTE — Telephone Encounter (Signed)
  Follow up Call-  Call back number 11/21/2018  Post procedure Call Back phone  # [phone]  Permission to leave phone message Yes  Some recent data might be hidden     Patient questions:  Do you have a fever, pain , or abdominal swelling? No. Pain Score  0 *  Have you tolerated food without any problems? Yes.    Have you been able to return to your normal activities? Yes.    Do you have any questions about your discharge instructions: Diet   No. Medications  No. Follow up visit  No.  Do you have questions or concerns about your Care? No.  Actions: * If pain score is 4 or above: No action needed, pain <4.  1. Have you developed a fever since your procedure? no  2.   Have you had an respiratory symptoms (SOB or cough) since your procedure? no  3.   Have you tested positive for COVID 19 since your procedure? no  4.   Have you had any family members/close contacts diagnosed with the COVID 19 since your procedure?  no   If yes to any of these questions please route to Joylene John, RN and Alphonsa Gin, Therapist, sports.

## 2018-11-25 NOTE — Telephone Encounter (Signed)
Attempted to reach patient for post-procedure f/u call. No answer. Left message that we will make another attempt to reach her again later today and for her to please not hesitate to call us if she has any questions/concerns regarding her care. 

## 2018-12-08 ENCOUNTER — Other Ambulatory Visit: Payer: 59

## 2019-12-25 DIAGNOSIS — S8002XA Contusion of left knee, initial encounter: Secondary | ICD-10-CM | POA: Diagnosis not present

## 2020-03-31 ENCOUNTER — Telehealth: Payer: Self-pay | Admitting: *Deleted

## 2020-03-31 NOTE — Telephone Encounter (Signed)
Left patient a message to call with insurance information prior to appointment on 04/04/2020 at 9:00 AM.

## 2020-04-04 ENCOUNTER — Other Ambulatory Visit: Payer: Self-pay

## 2020-04-04 ENCOUNTER — Encounter: Payer: Self-pay | Admitting: Obstetrics and Gynecology

## 2020-04-04 ENCOUNTER — Other Ambulatory Visit (HOSPITAL_COMMUNITY)
Admission: RE | Admit: 2020-04-04 | Discharge: 2020-04-04 | Disposition: A | Discharge status: Home / Self Care | Payer: BC Managed Care – PPO | Source: Ambulatory Visit | Attending: Obstetrics and Gynecology | Admitting: Obstetrics and Gynecology

## 2020-04-04 ENCOUNTER — Ambulatory Visit (INDEPENDENT_AMBULATORY_CARE_PROVIDER_SITE_OTHER): Payer: BC Managed Care – PPO | Admitting: Obstetrics and Gynecology

## 2020-04-04 VITALS — BP 116/78 | HR 69 | Ht 65.0 in | Wt 223.0 lb

## 2020-04-04 DIAGNOSIS — Z8619 Personal history of other infectious and parasitic diseases: Secondary | ICD-10-CM

## 2020-04-04 DIAGNOSIS — Z124 Encounter for screening for malignant neoplasm of cervix: Secondary | ICD-10-CM | POA: Insufficient documentation

## 2020-04-04 DIAGNOSIS — N9089 Other specified noninflammatory disorders of vulva and perineum: Secondary | ICD-10-CM

## 2020-04-04 DIAGNOSIS — Z1231 Encounter for screening mammogram for malignant neoplasm of breast: Secondary | ICD-10-CM | POA: Diagnosis not present

## 2020-04-04 DIAGNOSIS — Z01419 Encounter for gynecological examination (general) (routine) without abnormal findings: Secondary | ICD-10-CM

## 2020-04-04 MED ORDER — VALACYCLOVIR HCL 1 G PO TABS: 1000.0000 mg | ORAL_TABLET | Freq: Two times a day (BID) | ORAL | 2 refills | Status: AC

## 2020-04-04 MED ORDER — OMEPRAZOLE 20 MG PO CPDR: 20.0000 mg | DELAYED_RELEASE_CAPSULE | Freq: Every day | ORAL | 1 refills | Status: DC

## 2020-04-04 NOTE — Progress Notes (Signed)
Last pap- 09/16/2018- negative/positive HPV Needs refill of Omeprazole Pt has cold sores

## 2020-04-04 NOTE — Progress Notes (Signed)
GYNECOLOGY ANNUAL PREVENTATIVE CARE ENCOUNTER NOTE  Subjective:   Anne Mason is a 55 y.o. G31P2002 female here for a annual gynecologic exam. Current complaints: requests refills on a couple of meds. Occasional cold sores. Still bleeding monthly, not heavy, tolerable. S/p ablation and bleeding never stopped. Does not want to do anything else at this time.    Denies abnormal vaginal bleeding, discharge, pelvic pain, problems with intercourse or other gynecologic concerns. Declines STI screen.  Previously referred to Urology for incontinence, did not go but ordred a home stimulation device which has significantly improved leaking.   Gynecologic History Patient's last menstrual period was 03/10/2020. Contraception: ablation Last Pap: 09/2018. Results: negative cytology, positive high risk HPV Last mammogram: 08/2018. Results: Birads 3 DEXA: has never had  Obstetric History OB History  Gravida Para Term Preterm AB Living  2 2 2     2   SAB IAB Ectopic Multiple Live Births               # Outcome Date GA Lbr Len/2nd Weight Sex Delivery Anes PTL Lv  2 Term           1 Term             Past Medical History:  Diagnosis Date   Elevated cholesterol    diet controlled - no meds   GERD (gastroesophageal reflux disease)    HSV infection    Obesity (BMI 30.0-34.9)    SVD (spontaneous vaginal delivery)    x 2    Past Surgical History:  Procedure Laterality Date   DILITATION & CURRETTAGE/HYSTROSCOPY WITH NOVASURE ABLATION N/A 01/13/2014   Procedure: DILATATION & CURETTAGE WITH NOVASURE ABLATION;  Surgeon: Emily Filbert, MD;  Location: [facility];  Service: Gynecology;  Laterality: N/A;    Current Outpatient Medications on File Prior to Visit  Medication Sig Dispense Refill   Multiple Vitamin (MULTIVITAMIN) tablet Take 1 tablet by mouth daily. (Patient not taking: Reported on 04/04/2020)     No current facility-administered medications on file prior to visit.    No Known  Allergies  Social History   Socioeconomic History   Marital status: Married    Spouse name: Not on file   Number of children: 2   Years of education: Not on file   Highest education level: Not on file  Occupational History   Occupation: homemaker  Tobacco Use   Smoking status: Never Smoker   Smokeless tobacco: Never Used  Scientific laboratory technician Use: Never used  Substance and Sexual Activity   Alcohol use: Yes    Comment: 1 glass of wine per month   Drug use: No   Sexual activity: Yes    Partners: Male    Birth control/protection: Other-see comments    Comment: vasectomy  Other Topics Concern   Not on file  Social History Narrative   Not on file   Social Determinants of Health   Financial Resource Strain: Not on file  Food Insecurity: Not on file  Transportation Needs: Not on file  Physical Activity: Not on file  Stress: Not on file  Social Connections: Not on file  Intimate Partner Violence: Not on file    Family History  Problem Relation Age of Onset   Breast cancer Maternal Aunt        post menapausal   Hypertension Mother    Diabetes Mother    Heart disease Mother    Endometrial cancer Mother    Heart disease  Father    Stomach cancer Father    Diabetes Sister    The following portions of the patient's history were reviewed and updated as appropriate: allergies, current medications, past family history, past medical history, past social history, past surgical history and problem list.  Review of Systems Pertinent items are noted in HPI.   Objective:  BP 116/78    Pulse 69    Ht 5\' 5"  (1.651 m)    Wt 223 lb (101.2 kg)    LMP 03/10/2020    BMI 37.11 kg/m  CONSTITUTIONAL: Well-developed, well-nourished female in no acute distress.  HENT:  Normocephalic, atraumatic, External right and left ear normal. Oropharynx is clear and moist EYES: Conjunctivae and EOM are normal. Pupils are equal, round, and reactive to light. No scleral icterus.   NECK: Normal range of motion, supple, no masses.  Normal thyroid.  SKIN: Skin is warm and dry. No rash noted. Not diaphoretic. No erythema. No pallor. NEUROLOGIC: Alert and oriented to person, place, and time. Normal reflexes, muscle tone coordination. No cranial nerve deficit noted. PSYCHIATRIC: Normal mood and affect. Normal behavior. Normal judgment and thought content. CARDIOVASCULAR: Normal heart rate noted RESPIRATORY: Effort normal, no problems with respiration noted. BREASTS: Symmetric in size. No masses, skin changes, nipple drainage, or lymphadenopathy. ABDOMEN: Soft, no distention noted.  No tenderness, rebound or guarding.  PELVIC: Normal appearing external genitalia; small healing lesion superior to clitoris with appearance of HSV, normal appearing vaginal mucosa and cervix.  No abnormal discharge noted.  Pap smear obtained. Normal uterine size, no other palpable masses, no uterine or adnexal tenderness. MUSCULOSKELETAL: Normal range of motion. No tenderness.  No cyanosis, clubbing, or edema.  2+ distal pulses.  Exam done with chaperone present.   Assessment and Plan:   1. Well woman exam Healthy female exam Still having regular periods  2. Cervical cancer screening - Cytology - PAP( [facility])  3. Encounter for screening mammogram for malignant neoplasm of breast - MM Digital Screening; Future  4. H/O cold sores Refill valtrex  5. Vulvar lesion Suspect HSV lesion despite no pain, however patient took valtrex 2 weeks ago for oral lesion, likely blunted effect on genital lesion   Will follow up results of pap smear and manage accordingly. Encouraged improvement in diet and exercise.  COVID vaccine UTD Declines STI screen. Mammogram ordered Referral for colonoscopy UTD Flu vaccine UTD DEXA not due based on age  Routine preventative health maintenance measures emphasized. Please refer to After Visit Summary for other counseling recommendations.    Feliz Beam, MD, [place] for [facility])

## 2020-04-05 LAB — CYTOLOGY - PAP
Comment: NEGATIVE
Diagnosis: NEGATIVE
High risk HPV: NEGATIVE

## 2020-04-12 ENCOUNTER — Telehealth: Payer: Self-pay | Admitting: *Deleted

## 2020-04-12 NOTE — Telephone Encounter (Signed)
Left patient an urgent message to contact [facility] to schedule mammogram as soon as possible per Dr. Rosana Hoes message.  Sloan Leiter, MD  [place], Jane Canary, NT Hi, I got a message that this patient needs to be referred to the breast center for her follow up mammogram. Thanks.   -KMD

## 2020-10-19 IMAGING — US ULTRASOUND RIGHT BREAST LIMITED
1 series · 13 of 17 positions shown · non-contrast
Comparison: Previous exam(s).

CLINICAL DATA: The patient was called back for possible masses in
the slightly lateral superior right breast.

EXAM:
DIGITAL DIAGNOSTIC RIGHT MAMMOGRAM WITH CAD AND TOMO
ULTRASOUND RIGHT BREAST

[Series 1: ultrasound right breast limited · 0.06mm/px · 13 of 17 slices shown]
[im 1/17]
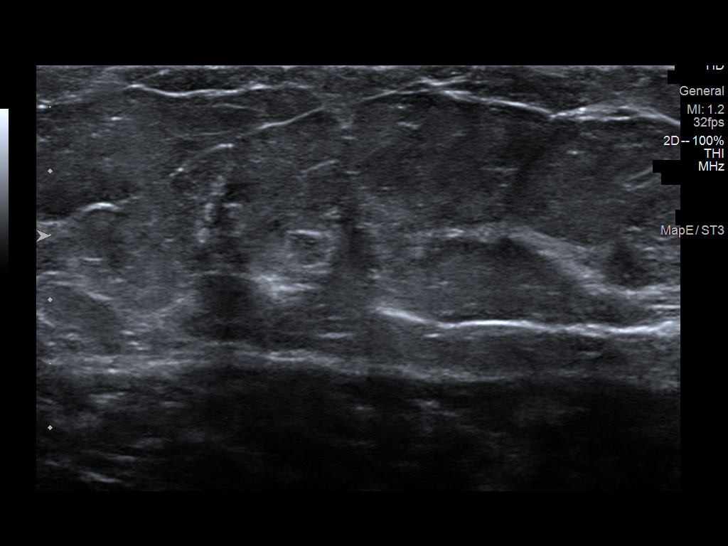
[im 2/17]
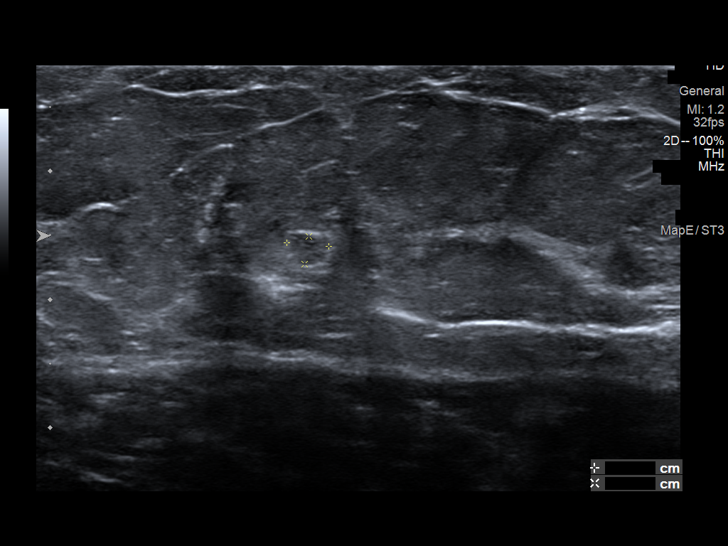
[im 4/17]
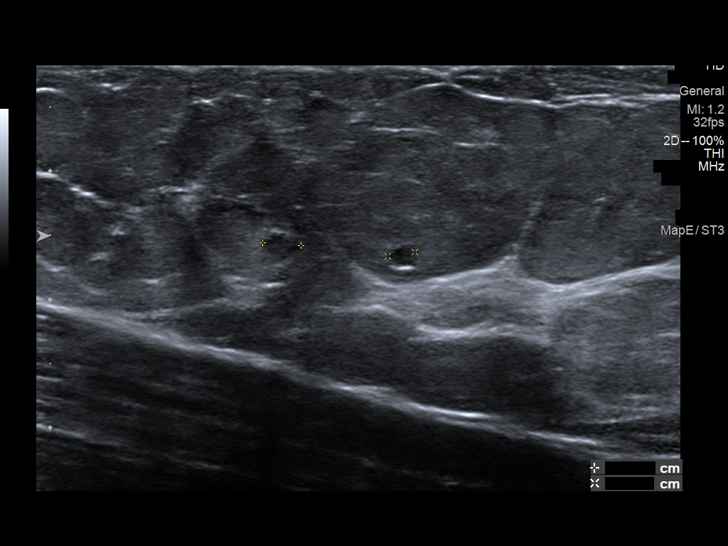
[im 5/17]
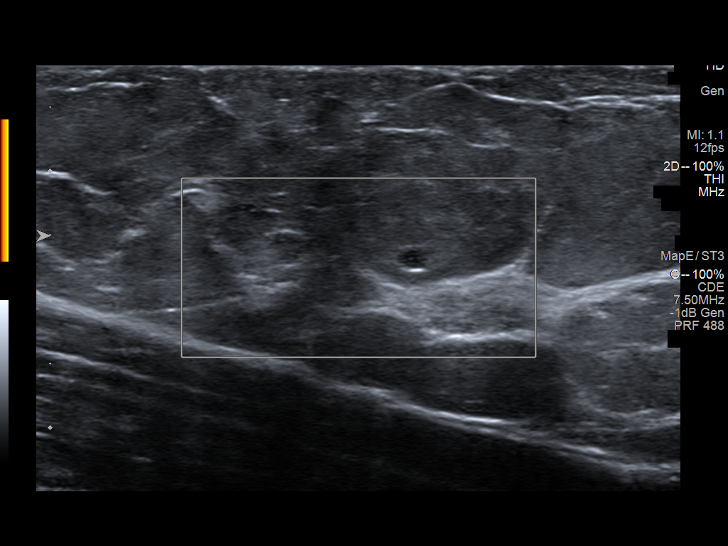
[im 6/17]
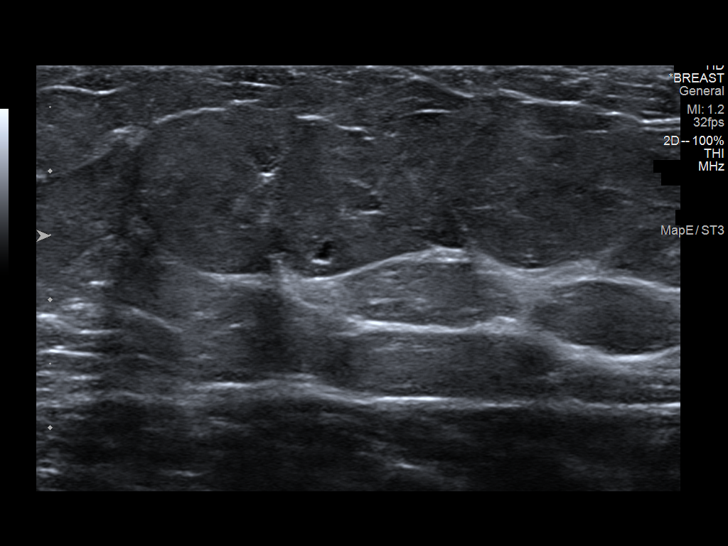
[im 8/17]
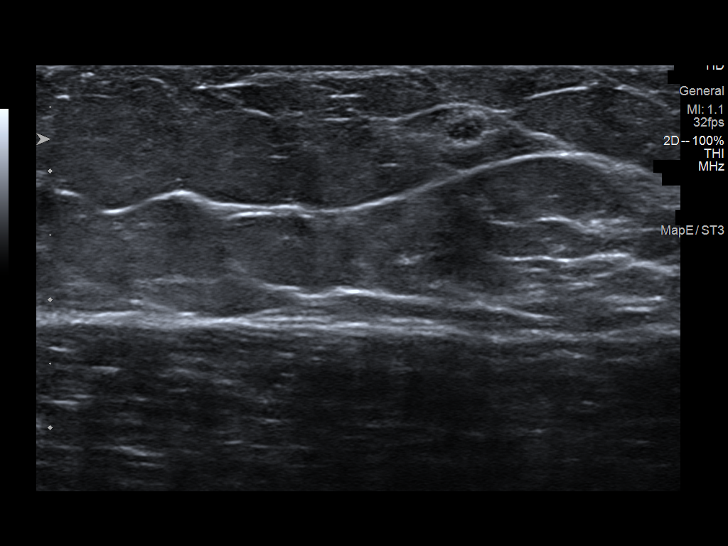
[im 9/17]
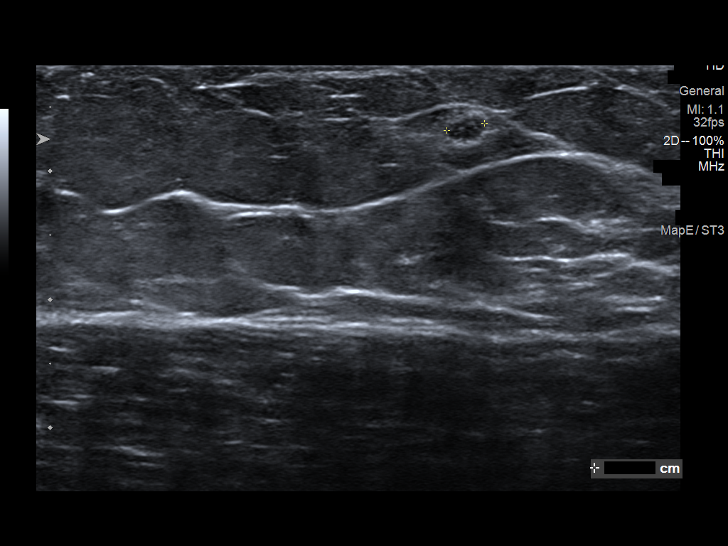
[im 10/17]
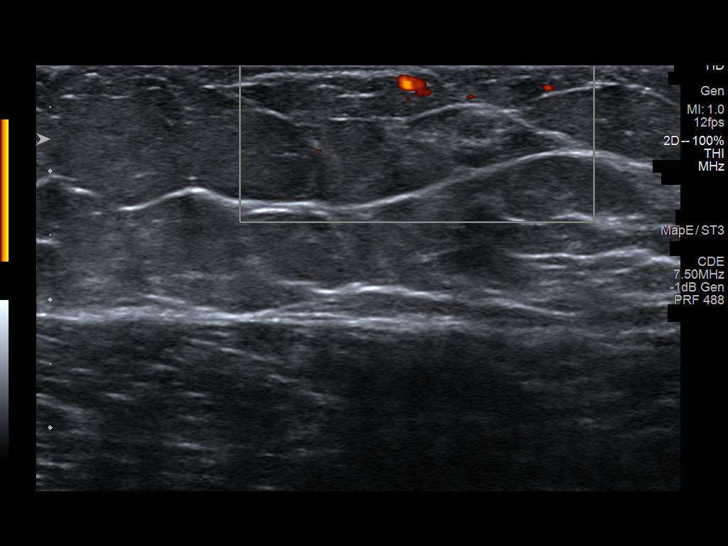
[im 12/17]
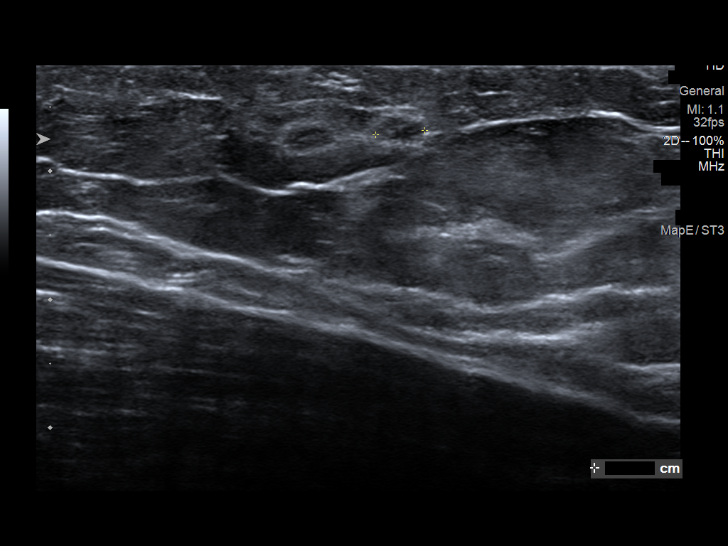
[im 13/17]
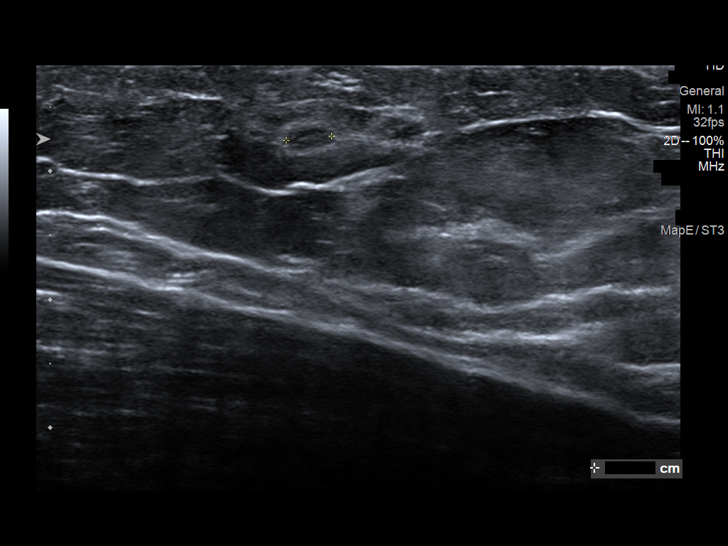
[im 14/17]
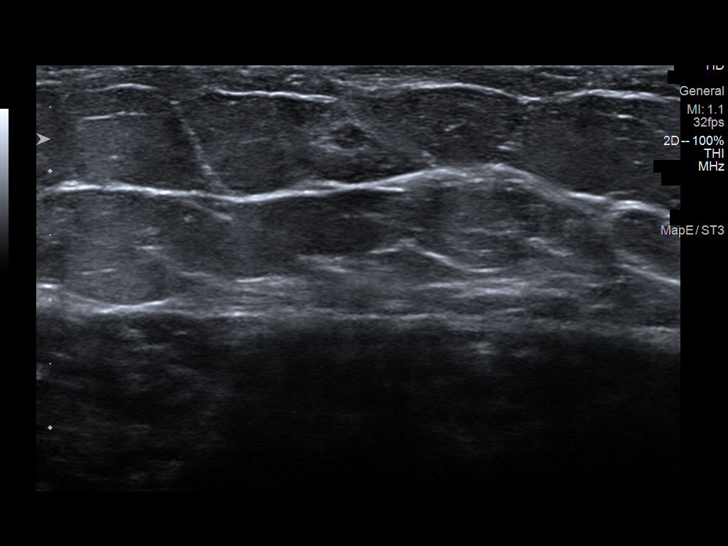
[im 16/17]
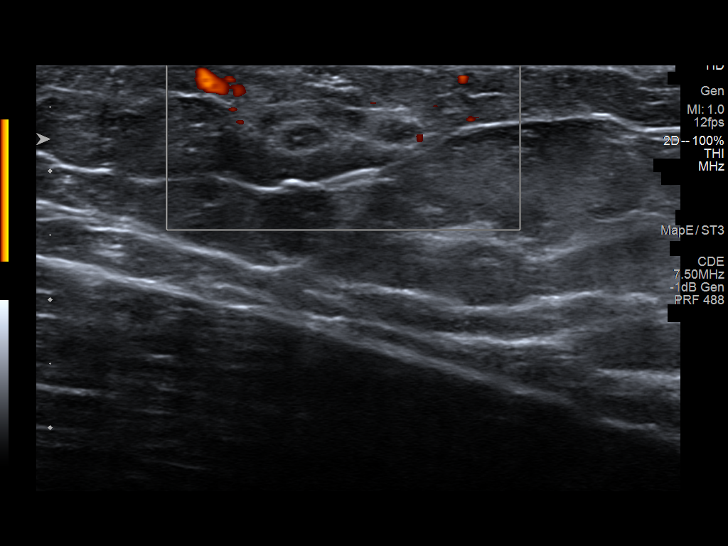
[im 17/17]
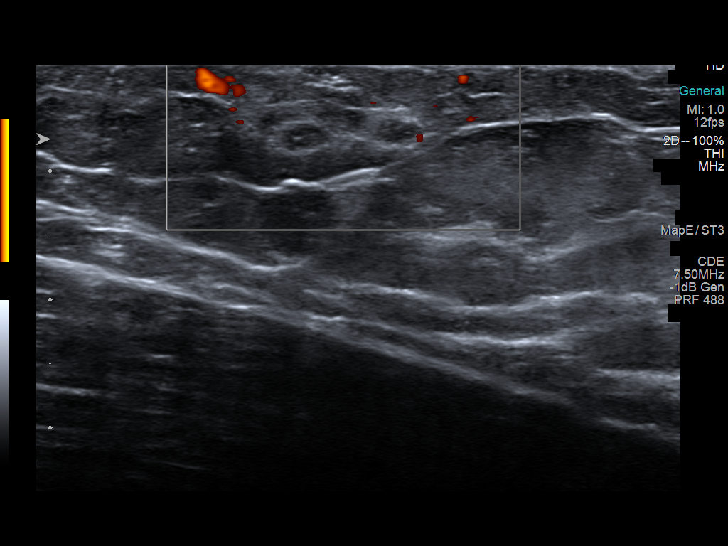

[13 of 17 positions shown; findings below may reference images not displayed]

ACR Breast Density Category b: There are scattered areas of
fibroglandular density.
FINDINGS: Low-density masses persist in the posterior, slightly lateral, and
superior right breast. Comparison to previous studies is difficult
due to difference in technique.

Mammographic images were processed with CAD.

On physical exam, no suspicious lumps are identified.

Targeted ultrasound is performed, showing several small anechoic and
hypoechoic nodules. Overall, there is increased echogenicity in the
region of these findings. One of these regions of nodularity which
is hypoechoic is seen at [DATE], 7 cm from the nipple measuring 3 x 2
x 3 mm. There is a small cyst in this region as well which is
anechoic. Two adjacent hypoechoic nodules are seen at [DATE], 8 cm
from the nipple, each measuring approximately 4 mm. Overall, the
appearance is most suggestive of fat necrosis. Complicated cysts are
possible. Small solid masses are considered less likely.
IMPRESSION: Probably benign nodularity in the right breast seen sonographically
and mammographically. Fat necrosis is considered most likely.
Complicated cysts are possible. Solid masses are considered less
likely.

RECOMMENDATION:
Recommend six-month follow-up mammogram and ultrasound of the
probably benign findings in the right breast.

I have discussed the findings and recommendations with the patient.
Results were also provided in writing at the conclusion of the
visit. If applicable, a reminder letter will be sent to the patient
regarding the next appointment.

BI-RADS CATEGORY  3: Probably benign.

## 2021-01-16 ENCOUNTER — Other Ambulatory Visit: Payer: Self-pay

## 2021-01-16 ENCOUNTER — Emergency Department
Admission: EM | Admit: 2021-01-16 | Discharge: 2021-01-16 | Disposition: A | Discharge status: Home / Self Care | Payer: BC Managed Care – PPO | Source: Home / Self Care | Attending: Family Medicine | Admitting: Family Medicine

## 2021-01-16 DIAGNOSIS — J111 Influenza due to unidentified influenza virus with other respiratory manifestations: Secondary | ICD-10-CM | POA: Diagnosis not present

## 2021-01-16 LAB — POC INFLUENZA A AND B ANTIGEN (URGENT CARE ONLY)
Influenza A Ag: NEGATIVE
Influenza B Ag: NEGATIVE

## 2021-01-16 LAB — POC SARS CORONAVIRUS 2 AG -  ED: SARS Coronavirus 2 Ag: NEGATIVE

## 2021-01-16 MED ORDER — OSELTAMIVIR PHOSPHATE 75 MG PO CAPS
75.0000 mg | ORAL_CAPSULE | Freq: Two times a day (BID) | ORAL | 0 refills | Status: DC
Start: 2021-01-16 — End: 2021-04-24

## 2021-01-16 MED ORDER — ACETAMINOPHEN 325 MG PO TABS
650.0000 mg | ORAL_TABLET | Freq: Once | ORAL | Status: AC
  Administered 2021-01-16: 650 mg via ORAL

## 2021-01-16 NOTE — ED Provider Notes (Signed)
[name]    CSN: [id number] Arrival date & time: 01/16/21  1740      History   Chief Complaint Chief Complaint  Patient presents with   Chills   Headache   Generalized Body Aches   Fever    HPI Anne Mason is a 56 y.o. female.   HPI  Patient is here for respiratory illness.  She has fever chills headache generalized body aches.  Temperature today is 101.7.  Symptoms started last night.  No known exposure to strep COVID or influenza.  She had sudden onset of fatigue and aches  Past Medical History:  Diagnosis Date   Elevated cholesterol    diet controlled - no meds   GERD (gastroesophageal reflux disease)    HSV infection    Obesity (BMI 30.0-34.9)    SVD (spontaneous vaginal delivery)    x 2    Patient Active Problem List   Diagnosis Date Noted   Special screening examination for viral disease 11/18/2018   Preventive measure 10/26/2011   GERD (gastroesophageal reflux disease) 10/26/2011   OBESITY, UNSPECIFIED 12/21/2008    Past Surgical History:  Procedure Laterality Date   DILITATION & CURRETTAGE/HYSTROSCOPY WITH NOVASURE ABLATION N/A 01/13/2014   Procedure: DILATATION & CURETTAGE WITH NOVASURE ABLATION;  Surgeon: Emily Filbert, MD;  Location: [facility];  Service: Gynecology;  Laterality: N/A;    OB History     Gravida  2   Para  2   Term  2   Preterm      AB      Living  2      SAB      IAB      Ectopic      Multiple      Live Births               Home Medications    Prior to Admission medications   Medication Sig Start Date End Date Taking? Authorizing Provider  oseltamivir (TAMIFLU) 75 MG capsule Take 1 capsule (75 mg total) by mouth every 12 (twelve) hours. 01/16/21  Yes Raylene Everts, MD  Multiple Vitamin (MULTIVITAMIN) tablet Take 1 tablet by mouth daily. Patient not taking: Reported on 04/04/2020    [provider]  omeprazole (PRILOSEC) 20 MG capsule Take 1 capsule (20 mg total) by mouth  daily. Patient not taking: Reported on 01/16/2021 04/04/20   Sloan Leiter, MD    Family History Family History  Problem Relation Age of Onset   Breast cancer Maternal Aunt        post menapausal   Hypertension Mother    Diabetes Mother    Heart disease Mother    Endometrial cancer Mother    Heart disease Father    Stomach cancer Father    Diabetes Sister     Social History Social History   Tobacco Use   Smoking status: Never   Smokeless tobacco: Never  Vaping Use   Vaping Use: Never used  Substance Use Topics   Alcohol use: Yes    Comment: 1 glass of wine per month   Drug use: No     Allergies   Patient has no known allergies.   Review of Systems Review of Systems See HPI  Physical Exam Triage Vital Signs ED Triage Vitals  Enc Vitals Group     BP 01/16/21 1836 128/88     Pulse Rate 01/16/21 1836 76     Resp 01/16/21 1836 14  Temp 01/16/21 1836 (!) 101.7 F (38.7 C)     Temp Source 01/16/21 1836 Oral     SpO2 01/16/21 1836 97 %     Weight 01/16/21 1838 215 lb (97.5 kg)     Height --      Head Circumference --      Peak Flow --      Pain Score 01/16/21 1838 4     Pain Loc --      Pain Edu? --      Excl. in [place]? --    No data found.  Updated Vital Signs BP 128/88 (BP Location: Left Arm)    Pulse 76    Temp (!) 101.7 F (38.7 C) (Oral)    Resp 14    Wt 97.5 kg    SpO2 97%    BMI 35.78 kg/m      Physical Exam Constitutional:      General: She is not in acute distress.    Appearance: She is well-developed. She is ill-appearing.  HENT:     Head: Normocephalic and atraumatic.     Mouth/Throat:     Pharynx: Posterior oropharyngeal erythema present.  Eyes:     Conjunctiva/sclera: Conjunctivae normal.     Pupils: Pupils are equal, round, and reactive to light.  Cardiovascular:     Rate and Rhythm: Normal rate and regular rhythm.     Heart sounds: Normal heart sounds.  Pulmonary:     Effort: Pulmonary effort is normal. No respiratory distress.      Breath sounds: Rhonchi present.  Abdominal:     General: There is no distension.     Palpations: Abdomen is soft.  Musculoskeletal:        General: Normal range of motion.     Cervical back: Normal range of motion.  Skin:    General: Skin is warm and dry.  Neurological:     General: No focal deficit present.     Mental Status: She is alert.  Psychiatric:        Mood and Affect: Mood normal.        Behavior: Behavior normal.     UC Treatments / Results  Labs (all labs ordered are listed, but only abnormal results are displayed) Labs Reviewed  POC SARS CORONAVIRUS 2 AG -  ED  POC INFLUENZA A AND B ANTIGEN (URGENT CARE ONLY)    EKG   Radiology No results found.  Procedures Procedures (including critical care time)  Medications Ordered in UC Medications  acetaminophen (TYLENOL) tablet 650 mg (650 mg Oral Given 01/16/21 1926)    Initial Impression / Assessment and Plan / UC Course  I have reviewed the triage vital signs and the nursing notes.  Pertinent labs & imaging results that were available during my care of the patient were reviewed by me and considered in my medical decision making (see chart for details).     Patient has an influenza-like illness.  Symptoms are suspicious.  No known exposure.  We did treat her with Tamiflu because she has an important family event she must attend later this week Final Clinical Impressions(s) / UC Diagnoses   Final diagnoses:  Influenza-like illness     Discharge Instructions      Drink lots of fluids Run a humidifier if there is one available Take over-the-counter cough and cold medicine such as Mucinex DM or Delsym Take the Tamiflu 2 times a day Call for problems   ED Prescriptions  Medication Sig Dispense Auth. Provider   oseltamivir (TAMIFLU) 75 MG capsule Take 1 capsule (75 mg total) by mouth every 12 (twelve) hours. 10 capsule Raylene Everts, MD      PDMP not reviewed this encounter.    Raylene Everts, MD 01/16/21 2022

## 2021-01-16 NOTE — ED Notes (Signed)
Tylenol offered again and pt accepted

## 2021-01-16 NOTE — Discharge Instructions (Signed)
Drink lots of fluids Run a humidifier if there is one available Take over-the-counter cough and cold medicine such as Mucinex DM or Delsym Take the Tamiflu 2 times a day Call for problems

## 2021-01-16 NOTE — ED Triage Notes (Signed)
Pt presents with chills, body aches, HA, and fever that began yesterday. Pt declines tylenol for fever

## 2021-01-17 DIAGNOSIS — H65193 Other acute nonsuppurative otitis media, bilateral: Secondary | ICD-10-CM | POA: Diagnosis not present

## 2021-01-17 DIAGNOSIS — J019 Acute sinusitis, unspecified: Secondary | ICD-10-CM | POA: Diagnosis not present

## 2021-01-21 DIAGNOSIS — R519 Headache, unspecified: Secondary | ICD-10-CM | POA: Diagnosis not present

## 2021-01-21 DIAGNOSIS — R509 Fever, unspecified: Secondary | ICD-10-CM | POA: Diagnosis not present

## 2021-01-21 DIAGNOSIS — Z20822 Contact with and (suspected) exposure to covid-19: Secondary | ICD-10-CM | POA: Diagnosis not present

## 2021-01-21 DIAGNOSIS — J029 Acute pharyngitis, unspecified: Secondary | ICD-10-CM | POA: Diagnosis not present

## 2021-01-21 DIAGNOSIS — Z79899 Other long term (current) drug therapy: Secondary | ICD-10-CM | POA: Diagnosis not present

## 2021-01-21 DIAGNOSIS — J069 Acute upper respiratory infection, unspecified: Secondary | ICD-10-CM | POA: Diagnosis not present

## 2021-04-20 NOTE — Progress Notes (Signed)
? ?ANNUAL EXAM ?Patient name: Anne Mason MRN [id number]  Date of birth: Jan 01, 1966 ?Chief Complaint:   ?Annual Exam ? ?History of Present Illness:   ?Anne Mason is a 56 y.o. G61P2002 female being seen today for a routine annual exam.  ? ?Current complaints: None ? ?Patient's last menstrual period was 11/23/2020. ? ? ?Last pap 03/2020. Results were: NILM w/ HRHPV negative. H/O abnormal pap: yes: ?2022: normal/hpv neg ?2020: normal/hpv pos ?2019: normal/hpv neg ?2014-15: normal/hpv neg ?2010-13: normal pap ? ?Health Maintenance Due  ?Topic Date Due  ? COVID-19 Vaccine (1) Never done  ? Hepatitis C Screening  Never done  ? Zoster Vaccines- Shingrix (1 of 2) Never done  ? MAMMOGRAM  08/28/2020  ? ? ? ?   ? View : No data to display.  ?  ?  ?  ? ?  ?   ? View : No data to display.  ?  ?  ?  ? ? ? ?Review of Systems:   ?Pertinent items are noted in HPI ?Denies any headaches, blurred vision, fatigue, shortness of breath, chest pain, abdominal pain, abnormal vaginal discharge/itching/odor/irritation, problems with periods, bowel movements, urination, or intercourse unless otherwise stated above.  ?Pertinent History Reviewed:  ?Reviewed past medical,surgical, social and family history.  ?Reviewed problem list, medications and allergies. ?Physical Assessment:  ? ?Vitals:  ? 04/24/21 1427  ?BP: 114/77  ?Pulse: 74  ?Weight: 225 lb (102.1 kg)  ?Height: _0  (1.651 m)  ?Body mass index is 37.44 kg/m?. ?  ?Physical Examination:  ?General appearance - well appearing, and in no distress ?Mental status - alert, oriented to person, place, and time ?Psych:  She has a normal mood and affect ?Skin - warm and dry, normal color, no suspicious lesions noted ?Chest - effort normal, all lung fields clear to auscultation bilaterally ?Heart - normal rate and regular rhythm ?Neck:  midline trachea, no thyromegaly or nodules ?Breasts - breasts appear normal, no suspicious masses, no skin or nipple changes or axillary nodes ?Abdomen - soft,  nontender, nondistended, no masses or organomegaly ?Pelvic -  ?VULVA: normal appearing vulva with no masses, tenderness or lesions  ?VAGINA: normal appearing vagina with normal color and discharge, no lesions   ?CERVIX: normal appearing cervix without discharge or lesions, no CMT ?UTERUS: uterus is felt to be normal size, shape, consistency and nontender  ?ADNEXA: No adnexal masses or tenderness noted. ?Extremities:  No swelling or varicosities noted ? ?Chaperone present for exam ? ?No results found for this or any previous visit (from the past 24 hour(s)).  ?Assessment & Plan:  ?Anne Mason was seen today for annual exam. ? ?Diagnoses and all orders for this visit: ? ?Encounter for annual routine gynecological examination ?- Cervical cancer screening: Discussed guidelines. Pap with HPV wnl 03/2020 ?- Breast Health: Encouraged self breast awareness/SBE. Discussed limits of clinical breast exam for detecting breast cancer. Discussed importance of annual MXR.  Rx given ?- Climacteric/Sexual health: Reviewed typical and atypical symptoms of menopause/peri-menopause. Discussed PMB and to call if any amount of spotting.  ?- Bone Health: Calcium via diet and supplementation.  ?- Colonoscopy: up to date - done 1.5 years ago ?- F/U 12 months and prn ?- Slip given for routine blood work per pt request.  ?-     MM 3D SCREEN BREAST BILATERAL; Future ?-     CBC ?-     HgB A1c ?-     Comp Met (CMET) ?-     Lipid Profile ? ?Eczema  of lower leg ?-     triamcinolone (KENALOG) 0.025 % ointment; Apply 1 application. topically 2 (two) times daily. ? ? ? ? ? ? ?Orders Placed This Encounter  ?Procedures  ? MM 3D SCREEN BREAST BILATERAL  ? CBC  ? HgB A1c  ? Comp Met (CMET)  ? Lipid Profile  ? ? ?Meds:  ?Meds ordered this encounter  ?Medications  ? triamcinolone (KENALOG) 0.025 % ointment  ?  Sig: Apply 1 application. topically 2 (two) times daily.  ?  Dispense:  30 g  ?  Refill:  0  ? ? ?Follow-up: Return in about 1 year (around 04/25/2022) for  annual. ? ?Radene Gunning, MD ?04/24/2021 ?2:59 PM ?

## 2021-04-24 ENCOUNTER — Ambulatory Visit (INDEPENDENT_AMBULATORY_CARE_PROVIDER_SITE_OTHER): Payer: BC Managed Care – PPO | Admitting: Obstetrics and Gynecology

## 2021-04-24 ENCOUNTER — Encounter: Payer: Self-pay | Admitting: Obstetrics and Gynecology

## 2021-04-24 VITALS — BP 114/77 | HR 74 | Ht 65.0 in | Wt 225.0 lb

## 2021-04-24 DIAGNOSIS — N6001 Solitary cyst of right breast: Secondary | ICD-10-CM

## 2021-04-24 DIAGNOSIS — R7309 Other abnormal glucose: Secondary | ICD-10-CM | POA: Diagnosis not present

## 2021-04-24 DIAGNOSIS — L309 Dermatitis, unspecified: Secondary | ICD-10-CM

## 2021-04-24 DIAGNOSIS — Z01419 Encounter for gynecological examination (general) (routine) without abnormal findings: Secondary | ICD-10-CM | POA: Diagnosis not present

## 2021-04-24 MED ORDER — OMEPRAZOLE 20 MG PO CPDR: 20.0000 mg | DELAYED_RELEASE_CAPSULE | Freq: Every day | ORAL | 2 refills | Status: DC

## 2021-04-24 MED ORDER — TRIAMCINOLONE ACETONIDE 0.025 % EX OINT: 1.0000 "application " | TOPICAL_OINTMENT | Freq: Two times a day (BID) | CUTANEOUS | 0 refills | Status: DC

## 2021-04-24 NOTE — Progress Notes (Signed)
Pt is requesting refill of Prilosec ?No concerns today ?

## 2021-04-26 DIAGNOSIS — E7800 Pure hypercholesterolemia, unspecified: Secondary | ICD-10-CM | POA: Diagnosis not present

## 2021-04-26 DIAGNOSIS — Z01419 Encounter for gynecological examination (general) (routine) without abnormal findings: Secondary | ICD-10-CM | POA: Diagnosis not present

## 2021-04-26 NOTE — Addendum Note (Signed)
Addended by: Asencion Islam on: 04/26/2021 11:25 AM ? ? Modules accepted: Orders ? ?

## 2021-04-27 LAB — COMPREHENSIVE METABOLIC PANEL
AG Ratio: 1.2 (calc) (ref 1.0–2.5)
ALT: 17 U/L (ref 6–29)
AST: 15 U/L (ref 10–35)
Albumin: 4.1 g/dL (ref 3.6–5.1)
Alkaline phosphatase (APISO): 75 U/L (ref 37–153)
BUN: 18 mg/dL (ref 7–25)
CO2: 27 mmol/L (ref 20–32)
Calcium: 9.3 mg/dL (ref 8.6–10.4)
Chloride: 106 mmol/L (ref 98–110)
Creat: 0.94 mg/dL (ref 0.50–1.03)
Globulin: 3.4 g/dL (calc) (ref 1.9–3.7)
Glucose, Bld: 96 mg/dL (ref 65–99)
Potassium: 4.3 mmol/L (ref 3.5–5.3)
Sodium: 140 mmol/L (ref 135–146)
Total Bilirubin: 0.3 mg/dL (ref 0.2–1.2)
Total Protein: 7.5 g/dL (ref 6.1–8.1)

## 2021-04-27 LAB — LIPID PANEL
Cholesterol: 288 mg/dL — ABNORMAL HIGH (ref <200)
HDL: 74 mg/dL (ref >=50)
LDL Cholesterol (Calc): 183 mg/dL (calc) — ABNORMAL HIGH
Non-HDL Cholesterol (Calc): 214 mg/dL (calc) — ABNORMAL HIGH (ref <130)
Total CHOL/HDL Ratio: 3.9 (calc) (ref <5.0)
Triglycerides: 159 mg/dL — ABNORMAL HIGH (ref <150)

## 2021-04-27 LAB — CBC
HCT: 39 % (ref 35.0–45.0)
Hemoglobin: 12.5 g/dL (ref 11.7–15.5)
MCH: 26.5 pg — ABNORMAL LOW (ref 27.0–33.0)
MCHC: 32.1 g/dL (ref 32.0–36.0)
MCV: 82.6 fL (ref 80.0–100.0)
MPV: 12.5 fL (ref 7.5–12.5)
Platelets: 222 10*3/uL (ref 140–400)
RBC: 4.72 10*6/uL (ref 3.80–5.10)
RDW: 14.1 % (ref 11.0–15.0)
WBC: 7.4 10*3/uL (ref 3.8–10.8)

## 2021-04-27 LAB — HEMOGLOBIN A1C
Hgb A1c MFr Bld: 6.2 % of total Hgb — ABNORMAL HIGH (ref <5.7)
Mean Plasma Glucose: 131 mg/dL
eAG (mmol/L): 7.3 mmol/L

## 2021-04-27 NOTE — Addendum Note (Signed)
Addended by: Radene Gunning A on: 04/27/2021 01:18 PM ? ? Modules accepted: Orders ? ?

## 2021-05-10 ENCOUNTER — Ambulatory Visit: Payer: BC Managed Care – PPO

## 2021-05-10 ENCOUNTER — Ambulatory Visit
Admission: RE | Admit: 2021-05-10 | Discharge: 2021-05-10 | Disposition: A | Discharge status: Home / Self Care | Payer: BC Managed Care – PPO | Source: Ambulatory Visit | Attending: Obstetrics and Gynecology | Admitting: Obstetrics and Gynecology

## 2021-05-10 DIAGNOSIS — N6001 Solitary cyst of right breast: Secondary | ICD-10-CM

## 2021-05-10 DIAGNOSIS — R928 Other abnormal and inconclusive findings on diagnostic imaging of breast: Secondary | ICD-10-CM | POA: Diagnosis not present

## 2021-09-05 ENCOUNTER — Ambulatory Visit: Payer: Self-pay

## 2021-09-05 NOTE — Patient Outreach (Signed)
  Care Coordination   09/05/2021 Name: Ellar Hakala MRN: [id number] DOB: 31-Aug-1965   Care Coordination Outreach Attempts:  An unsuccessful telephone outreach was attempted today to offer the patient information about available care coordination services as a benefit of their health plan.   Follow Up Plan:  Additional outreach attempts will be made to offer the patient care coordination information and services.   Encounter Outcome:  No Answer  Care Coordination Interventions Activated:  No   Care Coordination Interventions:  No, not indicated    Lazaro Arms RN, BSN, [facility]   Phone: [phone]

## 2021-09-07 ENCOUNTER — Ambulatory Visit: Payer: Self-pay

## 2021-09-07 NOTE — Patient Outreach (Signed)
  Care Coordination   09/07/2021 Name: Anne Mason MRN: [id number] DOB: September 08, 1965   Care Coordination Outreach Attempts:  A second unsuccessful outreach was attempted today to offer the patient with information about available care coordination services as a benefit of their health plan.     Follow Up Plan:  Additional outreach attempts will be made to offer the patient care coordination information and services.   Encounter Outcome:  No Answer  Care Coordination Interventions Activated:  No   Care Coordination Interventions:  No, not indicated    Lazaro Arms RN, BSN, [facility]   Phone: [phone]

## 2021-11-06 ENCOUNTER — Ambulatory Visit
Admission: EM | Admit: 2021-11-06 | Discharge: 2021-11-06 | Disposition: A | Discharge status: Home / Self Care | Payer: BC Managed Care – PPO | Attending: Family Medicine | Admitting: Family Medicine

## 2021-11-06 ENCOUNTER — Ambulatory Visit (INDEPENDENT_AMBULATORY_CARE_PROVIDER_SITE_OTHER): Payer: BC Managed Care – PPO

## 2021-11-06 DIAGNOSIS — S9031XA Contusion of right foot, initial encounter: Secondary | ICD-10-CM | POA: Diagnosis not present

## 2021-11-06 DIAGNOSIS — M7989 Other specified soft tissue disorders: Secondary | ICD-10-CM | POA: Diagnosis not present

## 2021-11-06 DIAGNOSIS — M19071 Primary osteoarthritis, right ankle and foot: Secondary | ICD-10-CM

## 2021-11-06 DIAGNOSIS — M25571 Pain in right ankle and joints of right foot: Secondary | ICD-10-CM | POA: Diagnosis not present

## 2021-11-06 MED ORDER — MELOXICAM 15 MG PO TABS: 15.0000 mg | ORAL_TABLET | Freq: Every day | ORAL | 1 refills | Status: DC

## 2021-11-06 NOTE — Discharge Instructions (Signed)
Take meloxicam once a day with food Limit walking while foot is painful May try ice or heat for pain relief She is medicine if you fail to improve over the next week or 2

## 2021-11-06 NOTE — ED Provider Notes (Addendum)
[name]    CSN: [id number] Arrival date & time: 11/06/21  1525      History   Chief Complaint Chief Complaint  Patient presents with   Ankle Pain    HPI Anne Mason is a 56 y.o. female.   HPI  Patient states that she was off a ladder over a week ago and as she was descending she felt a little unsteady so she hopped down the last couple of steps.  Landed squarely on her feet.  Did not hurt at the time but the next day her right foot was painful.  It continues to be painful.  She is walking with a limp.  She is trying to wear supportive sneakers.  No prior history of foot or ankle problems.  Past Medical History:  Diagnosis Date   Elevated cholesterol    diet controlled - no meds   GERD (gastroesophageal reflux disease)    HSV infection    Obesity (BMI 30.0-34.9)    SVD (spontaneous vaginal delivery)    x 2    Patient Active Problem List   Diagnosis Date Noted   Special screening examination for viral disease 11/18/2018   Preventive measure 10/26/2011   GERD (gastroesophageal reflux disease) 10/26/2011   OBESITY, UNSPECIFIED 12/21/2008    Past Surgical History:  Procedure Laterality Date   DILITATION & CURRETTAGE/HYSTROSCOPY WITH NOVASURE ABLATION N/A 01/13/2014   Procedure: DILATATION & CURETTAGE WITH NOVASURE ABLATION;  Surgeon: Emily Filbert, MD;  Location: [facility];  Service: Gynecology;  Laterality: N/A;    OB History     Gravida  2   Para  2   Term  2   Preterm      AB      Living  2      SAB      IAB      Ectopic      Multiple      Live Births               Home Medications    Prior to Admission medications   Medication Sig Start Date End Date Taking? Authorizing Provider  meloxicam (MOBIC) 15 MG tablet Take 1 tablet (15 mg total) by mouth daily. 11/06/21  Yes Raylene Everts, MD  Multiple Vitamin (MULTIVITAMIN) tablet Take 1 tablet by mouth daily. Patient not taking: Reported on 04/04/2020    [provider]  omeprazole (PRILOSEC) 20 MG capsule Take 1 capsule (20 mg total) by mouth daily. 04/24/21   Radene Gunning, MD  triamcinolone (KENALOG) 0.025 % ointment Apply 1 application. topically 2 (two) times daily. 04/24/21   Radene Gunning, MD    Family History Family History  Problem Relation Age of Onset   Breast cancer Maternal Aunt        post menapausal   Hypertension Mother    Diabetes Mother    Heart disease Mother    Endometrial cancer Mother    Heart disease Father    Stomach cancer Father    Diabetes Sister     Social History Social History   Tobacco Use   Smoking status: Never   Smokeless tobacco: Never  Vaping Use   Vaping Use: Never used  Substance Use Topics   Alcohol use: Yes    Comment: 1 glass of wine per month   Drug use: No     Allergies   Patient has no known allergies.   Review of Systems Review of Systems  See HPI Physical  Exam Triage Vital Signs ED Triage Vitals  Enc Vitals Group     BP 11/06/21 1534 118/84     Pulse Rate 11/06/21 1534 67     Resp 11/06/21 1534 12     Temp 11/06/21 1534 99 F (37.2 C)     Temp Source 11/06/21 1534 Oral     SpO2 11/06/21 1534 97 %     Weight --      Height --      Head Circumference --      Peak Flow --      Pain Score 11/06/21 1533 6     Pain Loc --      Pain Edu? --      Excl. in [place]? --    No data found.  Updated Vital Signs BP 118/84 (BP Location: Right Arm)   Pulse 67   Temp 99 F (37.2 C) (Oral)   Resp 12   LMP 11/23/2020   SpO2 97%   :     Physical Exam Constitutional:      General: She is not in acute distress.    Appearance: She is well-developed.  HENT:     Head: Normocephalic and atraumatic.  Eyes:     Conjunctiva/sclera: Conjunctivae normal.     Pupils: Pupils are equal, round, and reactive to light.  Cardiovascular:     Rate and Rhythm: Normal rate.  Pulmonary:     Effort: Pulmonary effort is normal. No respiratory distress.  Abdominal:     General: There is  no distension.     Palpations: Abdomen is soft.  Musculoskeletal:        General: Tenderness present. Normal range of motion.     Cervical back: Normal range of motion.     Comments: Mild tenderness diffusely around the lateral and medial malleolus.  Good range of motion.  No instability.  No soft tissue swelling noted.  No discoloration.  Skin:    General: Skin is warm and dry.  Neurological:     Mental Status: She is alert.     Gait: Gait abnormal.      UC Treatments / Results  Labs (all labs ordered are listed, but only abnormal results are displayed) Labs Reviewed - No data to display  EKG   Radiology DG Ankle Complete Right  Result Date: 11/06/2021 CLINICAL DATA:  Injury. No definite ladder 2 weeks ago. Lateral ankle pain. EXAM: RIGHT ANKLE - COMPLETE 3+ VIEW COMPARISON:  None Available. FINDINGS: Mild apparent degenerative spurring at the distal anterior aspect of the tibial plafond on lateral view. Minimal dorsal talonavicular degenerative osteophytes. Tiny plantar calcaneal heel spur. Mild lateral malleolar soft tissue swelling. No definite acute fracture is seen. No dislocation. IMPRESSION: Mild lateral malleolar soft tissue swelling. No definite acute fracture is seen. Electronically Signed   By: Elyse Prevo Kendall M.D.   On: 11/06/2021 15:54    Procedures Procedures (including critical care time)  Medications Ordered in UC Medications - No data to display  Initial Impression / Assessment and Plan / UC Course  I have reviewed the triage vital signs and the nursing notes.  Pertinent labs & imaging results that were available during my care of the patient were reviewed by me and considered in my medical decision making (see chart for details).  Clinical Course as of 11/06/21 1640  Mon Nov 06, 2021  1602 DG Ankle Complete Right [YN]    Clinical Course User Index [YN] Raylene Everts, MD  Explained to patient that she had traumatized arthritis in her foot and  ankle and that it is causing arthralgia.  We will treat with anti-inflammatories. Final Clinical Impressions(s) / UC Diagnoses   Final diagnoses:  Contusion of right foot, initial encounter  Primary osteoarthritis of right foot     Discharge Instructions      Take meloxicam once a day with food Limit walking while foot is painful May try ice or heat for pain relief She is medicine if you fail to improve over the next week or 2    ED Prescriptions     Medication Sig Dispense Auth. Provider   meloxicam (MOBIC) 15 MG tablet Take 1 tablet (15 mg total) by mouth daily. 15 tablet Raylene Everts, MD      PDMP not reviewed this encounter.   Raylene Everts, MD 11/06/21 0973    Raylene Everts, MD 11/06/21 1640

## 2021-11-06 NOTE — ED Triage Notes (Signed)
Pt presents with c/o rt ankle pain after jumping off of a ladder two weeks ago. Pt taking Ibuprofen for comfort.

## 2022-02-16 ENCOUNTER — Other Ambulatory Visit: Payer: Self-pay | Admitting: Obstetrics and Gynecology

## 2022-03-30 ENCOUNTER — Other Ambulatory Visit: Payer: Self-pay | Admitting: Obstetrics & Gynecology

## 2022-03-30 DIAGNOSIS — Z1231 Encounter for screening mammogram for malignant neoplasm of breast: Secondary | ICD-10-CM

## 2022-05-16 ENCOUNTER — Ambulatory Visit: Payer: BC Managed Care – PPO

## 2022-06-01 ENCOUNTER — Ambulatory Visit: Payer: BC Managed Care – PPO

## 2022-06-07 ENCOUNTER — Ambulatory Visit
Admission: RE | Admit: 2022-06-07 | Discharge: 2022-06-07 | Disposition: A | Discharge status: Home / Self Care | Payer: Self-pay | Source: Ambulatory Visit | Attending: Obstetrics & Gynecology | Admitting: Obstetrics & Gynecology

## 2022-06-07 DIAGNOSIS — Z1231 Encounter for screening mammogram for malignant neoplasm of breast: Secondary | ICD-10-CM

## 2022-08-06 ENCOUNTER — Encounter: Payer: Self-pay | Admitting: Emergency Medicine

## 2022-08-06 ENCOUNTER — Ambulatory Visit
Admission: EM | Admit: 2022-08-06 | Discharge: 2022-08-06 | Disposition: A | Discharge status: Home / Self Care | Payer: Managed Care, Other (non HMO) | Attending: Family Medicine | Admitting: Family Medicine

## 2022-08-06 DIAGNOSIS — H109 Unspecified conjunctivitis: Secondary | ICD-10-CM | POA: Diagnosis not present

## 2022-08-06 DIAGNOSIS — H5789 Other specified disorders of eye and adnexa: Secondary | ICD-10-CM

## 2022-08-06 MED ORDER — MOXIFLOXACIN HCL 0.5 % OP SOLN: 1.0000 [drp] | Freq: Three times a day (TID) | OPHTHALMIC | 0 refills | Status: AC

## 2022-08-06 NOTE — ED Triage Notes (Signed)
Patient states that she opened a new pack of eyeliner yesterday and applied to her eyes.  Noticed after applying, eyes started burning so she rinsed it of.  In the middle of the night, her eyes began draining "goopy" stuff and now they are red.  Denies any visual changes, no contact lenses, does wear eye glasses.  There is also a swollen lymph node on the right side of her neck that came up last night.

## 2022-08-06 NOTE — Discharge Instructions (Addendum)
Advised patient to instill eyedrops into both eyes as directed.  Encouraged increase daily water intake to 64 ounces per day while taking this medication.  Advised if symptoms worsen and/or resolve please follow-up with local optometrist or optometry for further evaluation.

## 2022-08-06 NOTE — ED Provider Notes (Signed)
[facility]    CSN: [id number] Arrival date & time: 08/06/22  1406      History   Chief Complaint Chief Complaint  Patient presents with   Eye Problem    HPI Anne Mason is a 57 y.o. female.   HPI Pleasant 57 year old female presents with concern for reaction using eyeliner.  She reports using eyeliner on bilateral lower lids immediately felt a burning sensation of her eyes and washed eyeliner off completely with soap and water and is thrown this liner away.  Patient does not wear contact lenses or glasses and reports last visual exam.  Past Medical History:  Diagnosis Date   Elevated cholesterol    diet controlled - no meds   GERD (gastroesophageal reflux disease)    HSV infection    Obesity (BMI 30.0-34.9)    SVD (spontaneous vaginal delivery)    x 2    Patient Active Problem List   Diagnosis Date Noted   Special screening examination for viral disease 11/18/2018   Preventive measure 10/26/2011   GERD (gastroesophageal reflux disease) 10/26/2011   OBESITY, UNSPECIFIED 12/21/2008    Past Surgical History:  Procedure Laterality Date   DILITATION & CURRETTAGE/HYSTROSCOPY WITH NOVASURE ABLATION N/A 01/13/2014   Procedure: DILATATION & CURETTAGE WITH NOVASURE ABLATION;  Surgeon: Allie Bossier, MD;  Location: [facility];  Service: Gynecology;  Laterality: N/A;    OB History     Gravida  2   Para  2   Term  2   Preterm      AB      Living  2      SAB      IAB      Ectopic      Multiple      Live Births               Home Medications    Prior to Admission medications   Medication Sig Start Date End Date Taking? Authorizing Provider  moxifloxacin (VIGAMOX) 0.5 % ophthalmic solution Place 1 drop into both eyes 3 (three) times daily for 5 days. 08/06/22 08/11/22 Yes Trevor Iha, FNP  Multiple Vitamin (MULTIVITAMIN) tablet Take 1 tablet by mouth daily.   Yes [provider]  omeprazole (PRILOSEC) 20 MG capsule Take 1 capsule  (20 mg total) by mouth daily. 04/24/21  Yes Milas Hock, MD  triamcinolone (KENALOG) 0.025 % ointment Apply 1 application. topically 2 (two) times daily. 04/24/21  Yes Milas Hock, MD  meloxicam (MOBIC) 15 MG tablet Take 1 tablet (15 mg total) by mouth daily. 11/06/21   Eustace Moore, MD    Family History Family History  Problem Relation Age of Onset   Hypertension Mother    Diabetes Mother    Heart disease Mother    Endometrial cancer Mother    Heart disease Father    Stomach cancer Father    Diabetes Sister    Breast cancer Maternal Aunt        post menapausal    Social History Social History   Tobacco Use   Smoking status: Never   Smokeless tobacco: Never  Vaping Use   Vaping status: Never Used  Substance Use Topics   Alcohol use: Yes    Comment: 1 glass of wine per month   Drug use: No     Allergies   Patient has no known allergies.   Review of Systems Review of Systems  Eyes:  Positive for pain and redness.  All other systems  reviewed and are negative.    Physical Exam Triage Vital Signs ED Triage Vitals  Encounter Vitals Group     BP      Systolic BP Percentile      Diastolic BP Percentile      Pulse      Resp      Temp      Temp src      SpO2      Weight      Height      Head Circumference      Peak Flow      Pain Score      Pain Loc      Pain Education      Exclude from Growth Chart    No data found.  Updated Vital Signs BP 128/86 (BP Location: Right Arm)   Pulse 69   Temp 98.7 F (37.1 C) (Oral)   Resp 16   Ht 5\' 5"  (1.651 m)   Wt 205 lb (93 kg)   LMP 11/23/2020   SpO2 97%   BMI 34.11 kg/m   Physical Exam Vitals and nursing note reviewed.  Constitutional:      Appearance: Normal appearance. She is obese.  HENT:     Head: Normocephalic and atraumatic.     Mouth/Throat:     Mouth: Mucous membranes are moist.     Pharynx: Oropharynx is clear.  Eyes:     Extraocular Movements: Extraocular movements intact.      Conjunctiva/sclera: Conjunctivae normal.     Pupils: Pupils are equal, round, and reactive to light.     Comments: Bilateral eyes: Sclera with +3 injection  Cardiovascular:     Rate and Rhythm: Normal rate and regular rhythm.     Pulses: Normal pulses.     Heart sounds: Normal heart sounds.  Pulmonary:     Effort: Pulmonary effort is normal.     Breath sounds: Normal breath sounds. No wheezing, rhonchi or rales.  Musculoskeletal:        General: Normal range of motion.     Cervical back: Normal range of motion and neck supple.  Skin:    General: Skin is warm and dry.  Neurological:     General: No focal deficit present.     Mental Status: She is alert and oriented to person, place, and time. Mental status is at baseline.  Psychiatric:        Mood and Affect: Mood normal.        Behavior: Behavior normal.      UC Treatments / Results  Labs (all labs ordered are listed, but only abnormal results are displayed) Labs Reviewed - No data to display  EKG   Radiology No results found.  Procedures Procedures (including critical care time)  Medications Ordered in UC Medications - No data to display  Initial Impression / Assessment and Plan / UC Course  I have reviewed the triage vital signs and the nursing notes.  Pertinent labs & imaging results that were available during my care of the patient were reviewed by me and considered in my medical decision making (see chart for details).     MDM: 1.  Conjunctivitis of both eyes, unspecified conjunctivitis type-Rx'd Vigamox 0.5% ophthalmic solution-Place 1 drop into both eyes 3 times daily for the next 5 days. Advised patient to instill eyedrops into both eyes as directed.  2.  Irritation of both eyes-presumably for eyeliner that patient immediately wiped off and threw away.  Encouraged increase  daily water intake to 64 ounces per day while taking this medication.  Advised if symptoms worsen and/or resolve please follow-up with  local optometrist or optometry for further evaluation.  Patient discharged home, hemodynamically stable. Final Clinical Impressions(s) / UC Diagnoses   Final diagnoses:  Conjunctivitis of both eyes, unspecified conjunctivitis type  Irritation of both eyes     Discharge Instructions      Advised patient to instill eyedrops into both eyes as directed.  Encouraged increase daily water intake to 64 ounces per day while taking this medication.  Advised if symptoms worsen and/or resolve please follow-up with local optometrist or optometry for further evaluation.     ED Prescriptions     Medication Sig Dispense Auth. Provider   moxifloxacin (VIGAMOX) 0.5 % ophthalmic solution Place 1 drop into both eyes 3 (three) times daily for 5 days. 3 mL Trevor Iha, FNP      PDMP not reviewed this encounter.   Trevor Iha, FNP 08/06/22 1547

## 2022-09-10 ENCOUNTER — Encounter: Payer: Self-pay | Admitting: Family Medicine

## 2022-09-10 ENCOUNTER — Ambulatory Visit: Payer: Managed Care, Other (non HMO) | Admitting: Family Medicine

## 2022-09-10 VITALS — BP 138/89 | HR 65 | Resp 20 | Ht 65.0 in | Wt 224.5 lb

## 2022-09-10 DIAGNOSIS — Z23 Encounter for immunization: Secondary | ICD-10-CM

## 2022-09-10 DIAGNOSIS — Z0000 Encounter for general adult medical examination without abnormal findings: Secondary | ICD-10-CM

## 2022-09-10 DIAGNOSIS — L309 Dermatitis, unspecified: Secondary | ICD-10-CM

## 2022-09-10 DIAGNOSIS — H04123 Dry eye syndrome of bilateral lacrimal glands: Secondary | ICD-10-CM | POA: Diagnosis not present

## 2022-09-10 DIAGNOSIS — Z6837 Body mass index (BMI) 37.0-37.9, adult: Secondary | ICD-10-CM

## 2022-09-10 DIAGNOSIS — K219 Gastro-esophageal reflux disease without esophagitis: Secondary | ICD-10-CM | POA: Diagnosis not present

## 2022-09-10 DIAGNOSIS — N3941 Urge incontinence: Secondary | ICD-10-CM

## 2022-09-10 MED ORDER — METHYLPREDNISOLONE 4 MG PO TBPK
ORAL_TABLET | ORAL | 0 refills | Status: DC
Start: 2022-09-10 — End: 2022-12-03

## 2022-09-10 MED ORDER — OMEPRAZOLE 20 MG PO CPDR
20.0000 mg | DELAYED_RELEASE_CAPSULE | Freq: Every day | ORAL | 2 refills | Status: DC
Start: 2022-09-10 — End: 2022-12-03

## 2022-09-10 MED ORDER — CLOBETASOL PROPIONATE 0.05 % EX CREA
1.0000 | TOPICAL_CREAM | Freq: Two times a day (BID) | CUTANEOUS | 0 refills | Status: DC
Start: 2022-09-10 — End: 2022-12-03

## 2022-09-10 NOTE — Assessment & Plan Note (Signed)
Pt notes history of urge incontinence. Discussed treatment options of medication vs pelvic floor therapy vs gyn consultation. Pt prefers at this time to work on pelvic floor therapy

## 2022-09-10 NOTE — Progress Notes (Signed)
New patient visit   Patient: Anne Mason   DOB: 1965-08-25   57 y.o. Female  MRN: [id number] Visit Date: 09/10/2022  Today's healthcare provider: Charlton Amor, DO   Chief Complaint  Patient presents with   New Patient (Initial Visit)    Establish care    SUBJECTIVE    Chief Complaint  Patient presents with   New Patient (Initial Visit)    Establish care   HPI  Patient presents today to establish care.  She is also wanting to do a wellness visit.  She does have a few concerns that she wants addressed as well.  She has concerns of dry eyes, bladder leakage, a leg rash, and would like to discuss weight loss medication.  Review of Systems  Constitutional:  Negative for activity change, fatigue and fever.  Eyes:        Dry eyes bilaterally  Respiratory:  Negative for cough and shortness of breath.   Cardiovascular:  Negative for chest pain.  Gastrointestinal:  Negative for abdominal pain.  Genitourinary:  Negative for difficulty urinating.       Current Meds  Medication Sig   clobetasol cream (TEMOVATE) 0.05 % Apply 1 Application topically 2 (two) times daily.   methylPREDNISolone (MEDROL DOSEPAK) 4 MG TBPK tablet Follow instructions on pill pack    OBJECTIVE    BP 138/89 (BP Location: Left Arm, Patient Position: Sitting, Cuff Size: Large)   Pulse 65   Resp 20   Ht 5\' 5"  (1.651 m)   Wt 224 lb 8 oz (101.8 kg)   LMP 11/23/2020   SpO2 100%   BMI 37.36 kg/m   Physical Exam Vitals and nursing note reviewed.  Constitutional:      General: She is not in acute distress.    Appearance: Normal appearance.  HENT:     Head: Normocephalic and atraumatic.     Right Ear: External ear normal.     Left Ear: External ear normal.     Nose: Nose normal.  Eyes:     Conjunctiva/sclera: Conjunctivae normal.  Cardiovascular:     Rate and Rhythm: Normal rate and regular rhythm.  Pulmonary:     Effort: Pulmonary effort is normal.     Breath sounds: Normal breath  sounds.  Skin:    Comments: Rash lateral left lower leg that is hyperpigmented  Neurological:     General: No focal deficit present.     Mental Status: She is alert and oriented to person, place, and time.  Psychiatric:        Mood and Affect: Mood normal.        Behavior: Behavior normal.        Thought Content: Thought content normal.        Judgment: Judgment normal.        ASSESSMENT & PLAN    Problem List Items Addressed This Visit       Digestive   GERD (gastroesophageal reflux disease)    - will go ahead and order vit d level since she is on long term omeprazole. -pt also wanted referral to GI      Relevant Medications   omeprazole (PRILOSEC) 20 MG capsule     Musculoskeletal and Integument   Eczema    Patient has a history of eczema and was using triamcinolone cream on her rash however she has a similar rash on her opposite leg that the triamcinolone cream did not help.  Recommended keeping the area moist  with emollients.  Also sent referral to dermatology as am not sure if she requires higher level of treatment.  In the meantime, we will change to clobetasol and see if this helps provide any relief. Will do medrol dose pack       Relevant Medications   clobetasol cream (TEMOVATE) 0.05 %   methylPREDNISolone (MEDROL DOSEPAK) 4 MG TBPK tablet   Other Relevant Orders   Ambulatory referral to Dermatology     Other   Obesity, unspecified    - will go ahead and order zepbound vs wegovy pending A1c.  - pt has been diligent with diet and exercise 3x-4x week. 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  - recommended patient follow with eye doctor for proper eye exam, I am wondering if pt has dry eyes.      Urge incontinence    Pt notes history of urge incontinence. Discussed treatment options of medication vs pelvic floor therapy vs gyn consultation. Pt prefers at this  time to work on pelvic floor therapy       Relevant Orders   Ambulatory referral to Physical Therapy   Other Visit Diagnoses     Routine adult health maintenance    -  Primary   Relevant Orders   Ambulatory referral to Gastroenterology   Hepatitis C Antibody   CBC   Basic Metabolic Panel (BMET)   Lipid panel   HgB A1c       No follow-ups on file.      Meds ordered this encounter  Medications   clobetasol cream (TEMOVATE) 0.05 %    Sig: Apply 1 Application topically 2 (two) times daily.    Dispense:  30 g    Refill:  0   omeprazole (PRILOSEC) 20 MG capsule    Sig: Take 1 capsule (20 mg total) by mouth daily.    Dispense:  90 capsule    Refill:  2   methylPREDNISolone (MEDROL DOSEPAK) 4 MG TBPK tablet    Sig: Follow instructions on pill pack    Dispense:  21 tablet    Refill:  0    Orders Placed This Encounter  Procedures   Zoster Recombinant (Shingrix )   Tdap vaccine greater than or equal to 7yo IM   Hepatitis C Antibody   CBC   Basic Metabolic Panel (BMET)    Order Specific Question:   Has the patient fasted?    Answer:   No   Lipid panel    Order Specific Question:   Has the patient fasted?    Answer:   No    Order Specific Question:   Release to patient    Answer:   Immediate   HgB A1c   Ambulatory referral to Gastroenterology    Referral Priority:   Routine    Referral Type:   Consultation    Referral Reason:   Specialty Services Required    Number of Visits Requested:   1   Ambulatory referral to Physical Therapy    Referral Priority:   Routine    Referral Type:   Physical Medicine    Referral Reason:   Specialty Services Required    Requested Specialty:   Physical Therapy    Number of Visits Requested:   1   Ambulatory referral to Dermatology    Referral Priority:   Routine  Referral Type:   Consultation    Referral Reason:   Specialty Services Required    Requested Specialty:   Dermatology    Number of Visits Requested:   1     Charlton Amor, DO  [facility] at [facility] [phone] (phone) [phone] (fax)  [facility]

## 2022-09-10 NOTE — Assessment & Plan Note (Signed)
-   will go ahead and order vit d level since she is on long term omeprazole. -pt also wanted referral to GI

## 2022-09-10 NOTE — Assessment & Plan Note (Signed)
Patient has a history of eczema and was using triamcinolone cream on her rash however she has a similar rash on her opposite leg that the triamcinolone cream did not help.  Recommended keeping the area moist with emollients.  Also sent referral to dermatology as am not sure if she requires higher level of treatment.  In the meantime, we will change to clobetasol and see if this helps provide any relief. Will do medrol dose pack

## 2022-09-10 NOTE — Assessment & Plan Note (Signed)
-   will go ahead and order zepbound vs wegovy pending A1c.  - pt has been diligent with diet and exercise 3x-4x week. 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

## 2022-09-10 NOTE — Assessment & Plan Note (Signed)
Pt has already tried pataday and allergy medicine.  - recommended patient follow with eye doctor for proper eye exam, I am wondering if pt has dry eyes.

## 2022-09-11 ENCOUNTER — Other Ambulatory Visit: Payer: Self-pay | Admitting: Family Medicine

## 2022-09-11 DIAGNOSIS — R7303 Prediabetes: Secondary | ICD-10-CM

## 2022-09-11 LAB — BASIC METABOLIC PANEL
BUN/Creatinine Ratio: 18 (ref 9–23)
BUN: 15 mg/dL (ref 6–24)
CO2: 26 mmol/L (ref 20–29)
Calcium: 9.1 mg/dL (ref 8.7–10.2)
Chloride: 101 mmol/L (ref 96–106)
Creatinine, Ser: 0.83 mg/dL (ref 0.57–1.00)
Glucose: 73 mg/dL (ref 70–99)
Potassium: 4 mmol/L (ref 3.5–5.2)
Sodium: 140 mmol/L (ref 134–144)
eGFR: 82 mL/min/{1.73_m2} (ref >59)

## 2022-09-11 LAB — CBC
Hematocrit: 38.1 % (ref 34.0–46.6)
Hemoglobin: 11.9 g/dL (ref 11.1–15.9)
MCH: 25.7 pg — ABNORMAL LOW (ref 26.6–33.0)
MCHC: 31.2 g/dL — ABNORMAL LOW (ref 31.5–35.7)
MCV: 82 fL (ref 79–97)
Platelets: 221 10*3/uL (ref 150–450)
RBC: 4.63 x10E6/uL (ref 3.77–5.28)
RDW: 14.6 % (ref 11.7–15.4)
WBC: 6.5 10*3/uL (ref 3.4–10.8)

## 2022-09-11 LAB — LIPID PANEL
Chol/HDL Ratio: 4.1 ratio (ref 0.0–4.4)
Cholesterol, Total: 281 mg/dL — ABNORMAL HIGH (ref 100–199)
HDL: 68 mg/dL (ref >39)
LDL Chol Calc (NIH): 185 mg/dL — ABNORMAL HIGH (ref 0–99)
Triglycerides: 154 mg/dL — ABNORMAL HIGH (ref 0–149)
VLDL Cholesterol Cal: 28 mg/dL (ref 5–40)

## 2022-09-11 LAB — HEMOGLOBIN A1C
Est. average glucose Bld gHb Est-mCnc: 137 mg/dL
Hgb A1c MFr Bld: 6.4 % — ABNORMAL HIGH (ref 4.8–5.6)

## 2022-09-11 LAB — HEPATITIS C ANTIBODY: Hep C Virus Ab: NONREACTIVE

## 2022-09-11 MED ORDER — ZEPBOUND 2.5 MG/0.5ML ~~LOC~~ SOAJ
2.5000 mg | SUBCUTANEOUS | 0 refills | Status: AC
Start: 2022-09-11 — End: ?

## 2022-09-13 ENCOUNTER — Encounter: Payer: Self-pay | Admitting: Family Medicine

## 2022-10-16 ENCOUNTER — Other Ambulatory Visit: Payer: Self-pay | Admitting: Family Medicine

## 2022-10-16 DIAGNOSIS — R7303 Prediabetes: Secondary | ICD-10-CM

## 2022-10-16 MED ORDER — ZEPBOUND 2.5 MG/0.5ML ~~LOC~~ SOAJ
2.5000 mg | SUBCUTANEOUS | 0 refills | Status: DC
Start: 2022-10-16 — End: 2022-10-25

## 2022-10-19 ENCOUNTER — Other Ambulatory Visit: Payer: Self-pay

## 2022-10-19 ENCOUNTER — Telehealth: Payer: Self-pay

## 2022-10-22 ENCOUNTER — Telehealth: Payer: Self-pay | Admitting: Family Medicine

## 2022-10-22 ENCOUNTER — Other Ambulatory Visit: Payer: Self-pay | Admitting: Family Medicine

## 2022-10-22 MED ORDER — TIRZEPATIDE-WEIGHT MANAGEMENT 5 MG/0.5ML ~~LOC~~ SOLN
5.0000 mg | SUBCUTANEOUS | 2 refills | Status: DC
Start: 2022-10-22 — End: 2022-10-25

## 2022-10-22 NOTE — Telephone Encounter (Signed)
Representative from [employer] called stating a new script with correct [id number] for the zepbound is needed. She said that script should state that it is auto injectable.

## 2022-10-22 NOTE — Telephone Encounter (Signed)
Pt.notified

## 2022-10-24 ENCOUNTER — Other Ambulatory Visit: Payer: Self-pay

## 2022-10-25 ENCOUNTER — Other Ambulatory Visit: Payer: Self-pay | Admitting: Family Medicine

## 2022-10-25 DIAGNOSIS — E66812 Morbid (severe) obesity due to excess calories: Secondary | ICD-10-CM

## 2022-10-25 MED ORDER — TIRZEPATIDE-WEIGHT MANAGEMENT 5 MG/0.5ML ~~LOC~~ SOLN
5.0000 mg | SUBCUTANEOUS | 0 refills | Status: DC
Start: 2022-10-25 — End: 2022-10-26

## 2022-10-25 MED ORDER — TIRZEPATIDE-WEIGHT MANAGEMENT 2.5 MG/0.5ML ~~LOC~~ SOLN: 2.5000 mg | SUBCUTANEOUS | 0 refills | Status: DC

## 2022-10-25 NOTE — Telephone Encounter (Signed)
Spoke with [employer]  They do not have zepbound as vial doses  Not available with [employer]  But is available with [employer] cash pay  Can be resent to [employer] cash pay.  [phone] NPI# [id number] NCPDC# [id number]

## 2022-10-25 NOTE — Telephone Encounter (Signed)
Patient informed. 

## 2022-10-26 ENCOUNTER — Other Ambulatory Visit: Payer: Self-pay | Admitting: Family Medicine

## 2022-10-26 ENCOUNTER — Telehealth: Payer: Self-pay

## 2022-10-26 DIAGNOSIS — E66812 Obesity, class 2: Secondary | ICD-10-CM

## 2022-10-26 MED ORDER — TIRZEPATIDE-WEIGHT MANAGEMENT 2.5 MG/0.5ML ~~LOC~~ SOLN
2.5000 mg | SUBCUTANEOUS | 0 refills | Status: DC
Start: 2022-10-26 — End: 2022-12-03

## 2022-10-26 MED ORDER — TIRZEPATIDE-WEIGHT MANAGEMENT 5 MG/0.5ML ~~LOC~~ SOLN
5.0000 mg | SUBCUTANEOUS | 1 refills | Status: DC
Start: 2022-10-26 — End: 2022-12-03

## 2022-10-26 NOTE — Telephone Encounter (Signed)
Initiated Prior authorization [id number] 2.5MG /0.5ML pen-injectors Via: Covermymeds Case/Key:[id number] Status: denied as of 10/26/22 Reason:Message from [employer]: Drug is not covered by plan Notified Pt via: Mychart

## 2022-10-26 NOTE — Telephone Encounter (Signed)
Message from [employer]: Drug is not covered by plan

## 2022-11-08 ENCOUNTER — Ambulatory Visit: Payer: Managed Care, Other (non HMO) | Admitting: Dermatology

## 2022-12-03 ENCOUNTER — Other Ambulatory Visit: Payer: Self-pay

## 2022-12-03 ENCOUNTER — Ambulatory Visit
Admission: EM | Admit: 2022-12-03 | Discharge: 2022-12-03 | Disposition: A | Discharge status: Home / Self Care | Payer: Managed Care, Other (non HMO) | Attending: Family Medicine | Admitting: Family Medicine

## 2022-12-03 DIAGNOSIS — J069 Acute upper respiratory infection, unspecified: Secondary | ICD-10-CM | POA: Diagnosis not present

## 2022-12-03 DIAGNOSIS — R062 Wheezing: Secondary | ICD-10-CM

## 2022-12-03 LAB — POC SARS CORONAVIRUS 2 AG -  ED: SARS Coronavirus 2 Ag: NEGATIVE

## 2022-12-03 MED ORDER — PREDNISONE 20 MG PO TABS: 40.0000 mg | ORAL_TABLET | Freq: Every day | ORAL | 0 refills | Status: DC

## 2022-12-03 NOTE — ED Provider Notes (Signed)
[facility]    CSN: [id number] Arrival date & time: 12/03/22  1205      History   Chief Complaint Chief Complaint  Patient presents with   Cough    HPI Harvey Kamer is a 57 y.o. female.   Patient is here for an upper respiratory infection.  She has concerns because she had recent air travel and is worried about exposure to COVID.  She states for the last couple of days she has had fever, cough, congestion, some yellow sputum.  No chest pain.  No shortness of breath.  Hears a "rattle" in her chest.  No history of asthma.  No history of smoking.  Has not had any fever chills or bodyaches    Past Medical History:  Diagnosis Date   Elevated cholesterol    diet controlled - no meds   GERD (gastroesophageal reflux disease)    HSV infection    Obesity (BMI 30.0-34.9)    SVD (spontaneous vaginal delivery)    x 2    Patient Active Problem List   Diagnosis Date Noted   Dry eyes, bilateral 09/10/2022   Eczema 09/10/2022   Urge incontinence 09/10/2022   Special screening examination for viral disease 11/18/2018   Preventive measure 10/26/2011   GERD (gastroesophageal reflux disease) 10/26/2011   Obesity, unspecified 12/21/2008    Past Surgical History:  Procedure Laterality Date   DILITATION & CURRETTAGE/HYSTROSCOPY WITH NOVASURE ABLATION N/A 01/13/2014   Procedure: DILATATION & CURETTAGE WITH NOVASURE ABLATION;  Surgeon: Allie Bossier, MD;  Location: [facility];  Service: Gynecology;  Laterality: N/A;    OB History     Gravida  2   Para  2   Term  2   Preterm      AB      Living  2      SAB      IAB      Ectopic      Multiple      Live Births               Home Medications    Prior to Admission medications   Medication Sig Start Date End Date Taking? Authorizing Provider  predniSONE (DELTASONE) 20 MG tablet Take 2 tablets (40 mg total) by mouth daily with breakfast. 12/03/22  Yes Eustace Moore, MD  Multiple Vitamin  (MULTIVITAMIN) tablet Take 1 tablet by mouth daily.    [provider]    Family History Family History  Problem Relation Age of Onset   Hypertension Mother    Diabetes Mother    Heart disease Mother    Endometrial cancer Mother    Heart disease Father    Stomach cancer Father    Diabetes Sister    Breast cancer Maternal Aunt        post menapausal    Social History Social History   Tobacco Use   Smoking status: Never   Smokeless tobacco: Never  Vaping Use   Vaping status: Never Used  Substance Use Topics   Alcohol use: Yes    Comment: 1 glass of wine per month   Drug use: No     Allergies   No known allergies   Review of Systems Review of Systems See HPI  Physical Exam Triage Vital Signs ED Triage Vitals  Encounter Vitals Group     BP 12/03/22 1215 124/84     Systolic BP Percentile --      Diastolic BP Percentile --  Pulse Rate 12/03/22 1215 80     Resp 12/03/22 1215 16     Temp 12/03/22 1215 98.6 F (37 C)     Temp Source 12/03/22 1215 Oral     SpO2 12/03/22 1215 97 %     Weight --      Height --      Head Circumference --      Peak Flow --      Pain Score 12/03/22 1218 0     Pain Loc --      Pain Education --      Exclude from Growth Chart --    No data found.  Updated Vital Signs BP 124/84   Pulse 80   Temp 98.6 F (37 C) (Oral)   Resp 16   LMP 11/23/2020   SpO2 97%       Physical Exam Constitutional:      General: She is not in acute distress.    Appearance: Normal appearance. She is well-developed.  HENT:     Head: Normocephalic and atraumatic.     Right Ear: Tympanic membrane and ear canal normal.     Left Ear: Tympanic membrane and ear canal normal.     Nose: Congestion present.     Comments: Erythema and swelling of nasal membranes.    Mouth/Throat:     Pharynx: No posterior oropharyngeal erythema.  Eyes:     Conjunctiva/sclera: Conjunctivae normal.     Pupils: Pupils are equal, round, and reactive to  light.  Cardiovascular:     Rate and Rhythm: Normal rate and regular rhythm.     Heart sounds: Normal heart sounds.  Pulmonary:     Effort: Pulmonary effort is normal. No respiratory distress.     Breath sounds: Wheezing and rhonchi present.     Comments: Few expiratory wheeze Abdominal:     General: There is no distension.     Palpations: Abdomen is soft.  Musculoskeletal:        General: Normal range of motion.     Cervical back: Normal range of motion and neck supple.  Skin:    General: Skin is warm and dry.  Neurological:     Mental Status: She is alert.      UC Treatments / Results  Labs (all labs ordered are listed, but only abnormal results are displayed) Labs Reviewed  POC SARS CORONAVIRUS 2 AG -  ED    EKG   Radiology No results found.  Procedures Procedures (including critical care time)  Medications Ordered in UC Medications - No data to display  Initial Impression / Assessment and Plan / UC Course  I have reviewed the triage vital signs and the nursing notes.  Pertinent labs & imaging results that were available during my care of the patient were reviewed by me and considered in my medical decision making (see chart for details).    Explained that over 90% of coughs and bronchitis are caused by viruses.  Symptomatic care is appropriate Final Clinical Impressions(s) / UC Diagnoses   Final diagnoses:  Acute upper respiratory infection  Wheezing     Discharge Instructions      Take prednisone once a day for 5 days Drink lots of water And a humidifier in the bedroom (if one is available) May use over-the-counter cough or cold medicines as needed Call if not improving towards the end of the week, or see your PCP   ED Prescriptions     Medication Sig Dispense Auth.  Provider   predniSONE (DELTASONE) 20 MG tablet Take 2 tablets (40 mg total) by mouth daily with breakfast. 10 tablet Eustace Moore, MD      PDMP not reviewed this  encounter.   Eustace Moore, MD 12/03/22 1318

## 2022-12-03 NOTE — ED Triage Notes (Signed)
Cough since yesterday, productive of yellow sputum. No fever. Did travel from [place] (flew).

## 2022-12-03 NOTE — Discharge Instructions (Addendum)
Take prednisone once a day for 5 days Drink lots of water And a humidifier in the bedroom (if one is available) May use over-the-counter cough or cold medicines as needed Call if not improving towards the end of the week, or see your PCP

## 2022-12-10 ENCOUNTER — Ambulatory Visit: Payer: Managed Care, Other (non HMO) | Admitting: Physician Assistant

## 2022-12-18 NOTE — Progress Notes (Unsigned)
   ANNUAL EXAM Patient name: Anne Mason MRN [id number]  Date of birth: [birth year] Chief Complaint:   No chief complaint on file.  History of Present Illness:   Anne Mason is a 57 y.o. [phone] female being seen today for a routine annual exam.   Current complaints: None  Patient's last menstrual period was 11/23/2020.   Last pap   2022: normal/hpv neg 2020: normal/hpv pos 2019: normal/hpv neg 2014-15: normal/hpv neg 2010-13: normal pap  Review of Systems:   Pertinent items are noted in HPI Denies any headaches, blurred vision, fatigue, shortness of breath, chest pain, abdominal pain, abnormal vaginal discharge/itching/odor/irritation, problems with periods, bowel movements, urination, or intercourse unless otherwise stated above.  Pertinent History Reviewed:  Reviewed past medical,surgical, social and family history.  Reviewed problem list, medications and allergies. Physical Assessment:   There were no vitals filed for this visit. There is no height or weight on file to calculate BMI.   Physical Examination:  General appearance - well appearing, and in no distress Mental status - alert, oriented to person, place, and time Psych:  She has a normal mood and affect Skin - warm and dry, normal color, no suspicious lesions noted Chest - effort normal, all lung fields clear to auscultation bilaterally Heart - normal rate and regular rhythm Neck:  midline trachea, no thyromegaly or nodules Breasts - breasts appear normal, no suspicious masses, no skin or nipple changes or axillary nodes Abdomen - soft, nontender, nondistended, no masses or organomegaly Pelvic -  VULVA: normal appearing vulva with no masses, tenderness or lesions  VAGINA: normal appearing vagina with normal color and discharge, no lesions   CERVIX: normal appearing cervix without discharge or lesions, no CMT UTERUS: uterus is felt to be normal size, shape, consistency and nontender  ADNEXA: No adnexal masses  or tenderness noted. Extremities:  No swelling or varicosities noted  Chaperone present for exam  No results found for this or any previous visit (from the past 24 hour(s)).  Assessment & Plan:  Anne Mason was seen today for annual exam.  Diagnoses and all orders for this visit:  Encounter for annual routine gynecological examination - Cervical cancer screening: Discussed guidelines. Pap with HPV wnl 03/2020 - Breast Health: Encouraged self breast awareness/SBE. Discussed limits of clinical breast exam for detecting breast cancer. Discussed importance of annual MXR.  Rx given - Climacteric/Sexual health: Reviewed typical and atypical symptoms of menopause/peri-menopause. Discussed PMB and to call if any amount of spotting.  - Bone Health: Calcium via diet and supplementation.  - Colonoscopy: up to date - 2022 - F/U 12 months and prn - Slip given for routine blood work per pt request. *** -     MM 3D SCREEN BREAST BILATERAL; Future -     CBC -     HgB A1c -     Comp Met (CMET) -     Lipid Profile  No orders of the defined types were placed in this encounter.   Meds:  No orders of the defined types were placed in this encounter.   Follow-up: No follow-ups on file.  Milas Hock, MD 12/18/2022 1:34 PM

## 2022-12-19 ENCOUNTER — Ambulatory Visit: Payer: Managed Care, Other (non HMO) | Admitting: Obstetrics and Gynecology

## 2022-12-19 DIAGNOSIS — Z01419 Encounter for gynecological examination (general) (routine) without abnormal findings: Secondary | ICD-10-CM

## 2023-02-18 ENCOUNTER — Ambulatory Visit: Payer: Managed Care, Other (non HMO) | Admitting: Internal Medicine

## 2023-03-05 ENCOUNTER — Ambulatory Visit: Payer: Managed Care, Other (non HMO) | Admitting: Internal Medicine

## 2023-03-27 ENCOUNTER — Encounter: Payer: Self-pay | Admitting: Obstetrics and Gynecology

## 2023-03-27 ENCOUNTER — Ambulatory Visit: Payer: Managed Care, Other (non HMO) | Admitting: Obstetrics and Gynecology

## 2023-03-27 ENCOUNTER — Other Ambulatory Visit (HOSPITAL_COMMUNITY)
Admission: RE | Admit: 2023-03-27 | Discharge: 2023-03-27 | Disposition: A | Discharge status: Home / Self Care | Source: Ambulatory Visit | Attending: Obstetrics and Gynecology | Admitting: Obstetrics and Gynecology

## 2023-03-27 VITALS — BP 145/91 | HR 76 | Ht 65.0 in | Wt 226.0 lb

## 2023-03-27 DIAGNOSIS — Z01419 Encounter for gynecological examination (general) (routine) without abnormal findings: Secondary | ICD-10-CM | POA: Insufficient documentation

## 2023-03-27 DIAGNOSIS — B001 Herpesviral vesicular dermatitis: Secondary | ICD-10-CM | POA: Diagnosis not present

## 2023-03-27 MED ORDER — VALACYCLOVIR HCL 1 G PO TABS: 1000.0000 mg | ORAL_TABLET | Freq: Every day | ORAL | 6 refills | Status: DC

## 2023-03-27 NOTE — Progress Notes (Signed)
   ANNUAL EXAM Patient name: Anne Mason MRN [id number]  Date of birth: [birth year] Chief Complaint:   Annual Exam  History of Present Illness:   Anne Mason is a 58 y.o. [phone] female being seen today for a routine annual exam.   Current complaints: None. Requests acyclovir for cold sores.   Daughter, Anne Mason, is going to med school. Plans to do neurology. Son lives in [place] doing [employer]. Anne Mason is going to school for PhD in leadership studies.   Patient's last menstrual period was 11/23/2020.   Last pap  2022: normal/hpv neg 2020: normal/hpv pos 2019: normal/hpv neg 2014-15: normal/hpv neg 2010-13: normal pap    Review of Systems:   Pertinent items are noted in HPI Denies any headaches, blurred vision, fatigue, shortness of breath, chest pain, abdominal pain, abnormal vaginal discharge/itching/odor/irritation, problems with periods, bowel movements, urination, or intercourse unless otherwise stated above.  Pertinent History Reviewed:  Reviewed past medical,surgical, social and family history.  Reviewed problem list, medications and allergies. Physical Assessment:   Vitals:   03/27/23 1103  BP: (!) 145/91  Pulse: 76  Weight: 226 lb (102.5 kg)  Height: 5\' 5"  (1.651 m)   Body mass index is 37.61 kg/m.   Physical Examination:  General appearance - well appearing, and in no distress Mental status - alert, oriented to person, place, and time Psych:  She has a normal mood and affect Skin - warm and dry, normal color, no suspicious lesions noted Chest - effort normal, all lung fields clear to auscultation bilaterally Heart - normal rate and regular rhythm Neck:  midline trachea, no thyromegaly or nodules Breasts - breasts appear normal, no suspicious masses, no skin or nipple changes or axillary nodes Abdomen - soft, nontender, nondistended, no masses or organomegaly Pelvic -  VULVA: normal appearing vulva with no masses, tenderness or lesions  VAGINA: normal  appearing vagina with normal color and discharge, no lesions CERVIX: normal appearing cervix without discharge or lesions, no CMT UTERUS: uterus is felt to be normal size, shape, consistency and nontender  ADNEXA: No adnexal masses or tenderness noted. Extremities:  No swelling or varicosities noted  Chaperone present for exam  No results found for this or any previous visit (from the past 24 hours).  Assessment & Plan:  Rayden was seen today for annual exam.  Diagnoses and all orders for this visit:  Encounter for annual routine gynecological examination - Cervical cancer screening: Discussed guidelines. Pap with HPV done - Breast Health: Encouraged self breast awareness/SBE. Discussed limits of clinical breast exam for detecting breast cancer. Discussed importance of annual MXR.  Rx given - Climacteric/Sexual health: Reviewed typical and atypical symptoms of menopause. Discussed PMB and to call if any amount of spotting.  - Bone Health: Calcium via diet and supplementation.  - Colonoscopy: up to date - done ~2022  - F/U 12 months and prn -     MM 3D SCREEN BREAST BILATERAL; Future   Orders Placed This Encounter  Procedures   MM 3D SCREENING MAMMOGRAM BILATERAL BREAST    Meds:  Meds ordered this encounter  Medications   valACYclovir (VALTREX) 1000 MG tablet    Sig: Take 1 tablet (1,000 mg total) by mouth daily. Take for 5 days    Dispense:  5 tablet    Refill:  6    Follow-up: No follow-ups on file.  Milas Hock, MD 03/27/2023 11:41 AM

## 2023-03-29 ENCOUNTER — Encounter: Payer: Self-pay | Admitting: Physician Assistant

## 2023-03-29 ENCOUNTER — Ambulatory Visit: Admitting: Physician Assistant

## 2023-03-29 ENCOUNTER — Encounter: Payer: Self-pay | Admitting: Obstetrics and Gynecology

## 2023-03-29 VITALS — BP 121/81 | HR 75 | Ht 65.0 in | Wt 226.2 lb

## 2023-03-29 DIAGNOSIS — Z7184 Encounter for health counseling related to travel: Secondary | ICD-10-CM | POA: Diagnosis not present

## 2023-03-29 DIAGNOSIS — E7800 Pure hypercholesterolemia, unspecified: Secondary | ICD-10-CM | POA: Diagnosis not present

## 2023-03-29 DIAGNOSIS — R7303 Prediabetes: Secondary | ICD-10-CM | POA: Diagnosis not present

## 2023-03-29 DIAGNOSIS — L309 Dermatitis, unspecified: Secondary | ICD-10-CM

## 2023-03-29 DIAGNOSIS — L219 Seborrheic dermatitis, unspecified: Secondary | ICD-10-CM

## 2023-03-29 LAB — CYTOLOGY - PAP
Comment: NEGATIVE
Diagnosis: UNDETERMINED — AB
High risk HPV: NEGATIVE

## 2023-03-29 MED ORDER — ATOVAQUONE-PROGUANIL HCL 250-100 MG PO TABS: 1.0000 | ORAL_TABLET | Freq: Every day | ORAL | 0 refills | Status: DC

## 2023-03-29 MED ORDER — ONDANSETRON 8 MG PO TBDP: 8.0000 mg | ORAL_TABLET | Freq: Three times a day (TID) | ORAL | 1 refills | Status: DC | PRN

## 2023-03-29 MED ORDER — CLOBETASOL PROPIONATE 0.05 % EX FOAM: Freq: Two times a day (BID) | CUTANEOUS | 0 refills | Status: DC

## 2023-03-29 MED ORDER — CIPROFLOXACIN HCL 500 MG PO TABS
500.0000 mg | ORAL_TABLET | Freq: Two times a day (BID) | ORAL | 0 refills | Status: DC
Start: 2023-03-29 — End: 2023-05-10

## 2023-03-29 MED ORDER — FLUCONAZOLE 150 MG PO TABS: 150.0000 mg | ORAL_TABLET | Freq: Once | ORAL | 0 refills | Status: AC

## 2023-03-29 MED ORDER — TYPHOID VACCINE PO CPDR: 1.0000 | DELAYED_RELEASE_CAPSULE | ORAL | 0 refills | Status: DC

## 2023-03-29 NOTE — Patient Instructions (Signed)
Travel Vaccine Information Vaccines (immunizations) can protect you from certain diseases. If you plan to travel to another country, see your health care provider or a travel medicine specialist to discuss: Where you are going. Include all countries in your travel schedule. How long you are staying. What you will be doing. Based on this information, your health care provider may recommend: Routine vaccines. These vaccines are standard for all children and adults. Travel vaccines: For most travelers. These vaccines are recommended for most travelers before foreign travel. For some travelers. These vaccines may be necessary based on the destination country or region. It is important to see your health care provider at least 4-6 weeks before you travel, and bring your vaccine records. This allows time for recommended vaccines to take effect. It also provides enough time for you to get vaccines that must be given in a series over a period of days or weeks. If you have fewer than 4 weeks before you leave, you should still see your health care provider. You might still benefit from vaccines or medicines. What are routine vaccines? Routine vaccines are shots that can protect you from common diseases in many parts of the world. Most routine vaccines are given at certain ages starting in childhood. It is important that you are up to date on your routine vaccines before you travel. Routine vaccines include: An annual flu (influenza) vaccine. The annual influenza vaccine sometimes differs for the northern and southern hemispheres. You should: Get both vaccines if you are traveling to the other hemisphere and you have a chronic medical condition. Get the vaccine shortly before or during the flu season, and only if the vaccine in your country differs from the vaccine in your destination country. Get the other influenza vaccine either before leaving the country or shortly after arriving at the destination  country. Age-related vaccines. Infants 6-11 months old should receive a measles, mumps, and rubella (MMR) vaccine before traveling to another country. Children and adults should be up to date with all recommended vaccines. Adults 60 years or older should talk to their health care provider about getting a vaccine against a certain type of pneumonia (pneumococcal) and a vaccine against shingles (herpes zoster). Extra doses of certain vaccines (booster vaccines), such as Tdap (tetanus, diphtheria, and pertussis). What are recommended vaccines? Recommended travel vaccines change over time. Your health care provider can tell you what vaccines are recommended before your trip. Recommended vaccines will depend on: The country or countries of travel. Whether you will be traveling to rural areas. How long you will be traveling. The season of the year. Your health status. Your vaccine history. For most travelers The following vaccines are recommended for most international travelers, depending on the country or countries you are traveling to: Hepatitis A. Typhoid. For some travelers Additional vaccines may be required when traveling to certain countries, due to a disease being common in a particular area or an ongoing outbreak of a disease. The following vaccines may be recommended based on where you are traveling: Yellow fever vaccine. This is required before traveling to certain countries in [place] and [place]. Get the yellow fever vaccine at an approved center at least 10 days before your trip. You will receive a stamp, certificate, or other proof of yellow fever immunization. If proof of immunization is incomplete or inaccurate, you could be medically isolated (quarantined), denied entry, or given another dose of vaccine at the travel site. If it has been longer than 10 years since you   received the yellow fever vaccine, another dose is required. Meningococcal vaccine. This may be required  prior to travel to parts of [place] and [place]. Get this vaccine at least 10 days before your trip. After 10 days, most people show immunity to meningococcal disease. Proof of meningococcal immunization is required by the [place] [employer] for any person taking part in a Muslim pilgrimage. You may not receive a visa if you are not able to provide proof of immunization. If it has been longer than 3 years since your last immunization, another dose may be required. Polio vaccine. If you travel to a country where there is a higher risk of getting polio, you may need a booster dose. Polio is a routine vaccine that most people receive as a child. Even if you completed the vaccine series as a child, you may need a booster dose before traveling to high-risk countries. Infants and children may need to follow an accelerated schedule to complete the polio vaccine series before traveling to high-risk countries. Some countries may require you to show proof that you have been vaccinated. Depending on your travel plans, you may need additional vaccines, such as: Hepatitis B. Rabies. Tick-borne encephalitis. Cholera. Where to find more information Centers for Disease Control and Prevention (CDC): www.cdc.gov World Health Organization (WHO): www.who.int U.S. Department of Health and Human Services: www.vaccines.gov Summary Vaccines (immunizations) can protect you from certain diseases. See your health care provider at least 4-6 weeks before you travel, and bring your vaccine records. This allows time for vaccines to take effect. Vaccines for travelers include routine vaccines, recommended travel vaccines, and geographically required travel vaccines. The most commonly recommended travel vaccines are the hepatitis A and typhoid vaccines. This information is not intended to replace advice given to you by your health care provider. Make sure you discuss any questions you have with your health  care provider. Document Revised: 02/22/2020 Document Reviewed: 02/22/2020 Elsevier Patient Education  2024 Elsevier Inc.  

## 2023-03-29 NOTE — Progress Notes (Signed)
 Established Patient Office Visit  Subjective   Patient ID: Anne Mason, female    DOB: Jan 31, 1965  Age: 58 y.o. MRN: [id number]  No chief complaint on file.   HPI Pt is coming in for medication refills and to discuss anything she needs for travel to [place], [place].   She did have the itchy place on right shoulder come back and would like refill of topical steroid. She continues to have issues with itchy and flaky scalp. She has not tried anything prescription.     ROS See HPI.    Objective:     LMP 11/23/2020  BP Readings from Last 3 Encounters:  03/29/23 121/81  03/27/23 (!) 145/91  12/03/22 124/84   Wt Readings from Last 3 Encounters:  03/29/23 226 lb 4 oz (102.6 kg)  03/27/23 226 lb (102.5 kg)  09/10/22 224 lb 8 oz (101.8 kg)      Physical Exam Constitutional:      Appearance: Normal appearance. She is obese.  HENT:     Head: Normocephalic.     Comments: Hair is rows: no significant erythema or flakes seen on exam.  Cardiovascular:     Rate and Rhythm: Normal rate and regular rhythm.  Pulmonary:     Effort: Pulmonary effort is normal.     Breath sounds: Normal breath sounds.  Musculoskeletal:     Right lower leg: No edema.     Left lower leg: No edema.  Skin:    Comments: Dime sized patch of dry skin.   Neurological:     General: No focal deficit present.     Mental Status: She is oriented to person, place, and time.  Psychiatric:        Mood and Affect: Mood normal.        The 10-year ASCVD risk score (Arnett DK, et al., 2019) is: 6.7%    Assessment & Plan:  Marland KitchenMarland KitchenLaterra was seen today for annual exam.  Diagnoses and all orders for this visit:  Travel advice encounter -     ciprofloxacin (CIPRO) 500 MG tablet; Take 1 tablet (500 mg total) by mouth 2 (two) times daily. For 10 days. -     fluconazole (DIFLUCAN) 150 MG tablet; Take 1 tablet (150 mg total) by mouth once for 1 dose. Repeat in 48 to 72 hours if symptoms persist -     ondansetron  (ZOFRAN-ODT) 8 MG disintegrating tablet; Take 1 tablet (8 mg total) by mouth every 8 (eight) hours as needed. -     atovaquone-proguanil (MALARONE) 250-100 MG TABS tablet; Take 1 tablet by mouth daily. One tab PO daily starting 2 days before travel and finish 7 days after travel. -     typhoid (VIVOTIF) DR capsule; Take 1 capsule by mouth every other day. -     Lipid panel -     CMP14+EGFR -     Hemoglobin A1c -     TSH  Eczema, unspecified type -     clobetasol cream (TEMOVATE) 0.05 %; Apply 1 Application topically 2 (two) times daily.  Pre-diabetes -     Hemoglobin A1c  Elevated LDL cholesterol level -     Lipid panel  Seborrheic dermatitis -     clobetasol (OLUX) 0.05 % topical foam; Apply topically 2 (two) times daily.   Pt reports covid booster this fall UTD on Flu and Tdap She will get her 2nd shingles at pharmacy Typhoid oral vaccine and malaria rx given today Cipro/diflucan/zofran for as needed usage  on trip to take Labs ordered for management  Clobetasol foam for hair and ointment for skin for eczema and seb dermatitis   Tandy Gaw, PA-C

## 2023-03-30 LAB — CMP14+EGFR
ALT: 27 IU/L (ref 0–32)
AST: 23 IU/L (ref 0–40)
Albumin: 4.4 g/dL (ref 3.8–4.9)
Alkaline Phosphatase: 98 IU/L (ref 44–121)
BUN/Creatinine Ratio: 19 (ref 9–23)
BUN: 16 mg/dL (ref 6–24)
Bilirubin Total: 0.3 mg/dL (ref 0.0–1.2)
CO2: 23 mmol/L (ref 20–29)
Calcium: 9.3 mg/dL (ref 8.7–10.2)
Chloride: 105 mmol/L (ref 96–106)
Creatinine, Ser: 0.85 mg/dL (ref 0.57–1.00)
Globulin, Total: 3.1 g/dL (ref 1.5–4.5)
Glucose: 91 mg/dL (ref 70–99)
Potassium: 4.4 mmol/L (ref 3.5–5.2)
Sodium: 142 mmol/L (ref 134–144)
Total Protein: 7.5 g/dL (ref 6.0–8.5)
eGFR: 80 mL/min/{1.73_m2} (ref >59)

## 2023-03-30 LAB — LIPID PANEL
Chol/HDL Ratio: 4.1 ratio (ref 0.0–4.4)
Cholesterol, Total: 274 mg/dL — ABNORMAL HIGH (ref 100–199)
HDL: 67 mg/dL (ref >39)
LDL Chol Calc (NIH): 185 mg/dL — ABNORMAL HIGH (ref 0–99)
Triglycerides: 122 mg/dL (ref 0–149)
VLDL Cholesterol Cal: 22 mg/dL (ref 5–40)

## 2023-03-30 LAB — TSH: TSH: 0.937 u[IU]/mL (ref 0.450–4.500)

## 2023-03-30 LAB — HEMOGLOBIN A1C
Est. average glucose Bld gHb Est-mCnc: 131 mg/dL
Hgb A1c MFr Bld: 6.2 % — ABNORMAL HIGH (ref 4.8–5.6)

## 2023-04-01 ENCOUNTER — Encounter: Payer: Self-pay | Admitting: Physician Assistant

## 2023-04-01 DIAGNOSIS — K219 Gastro-esophageal reflux disease without esophagitis: Secondary | ICD-10-CM

## 2023-04-01 DIAGNOSIS — E7800 Pure hypercholesterolemia, unspecified: Secondary | ICD-10-CM | POA: Insufficient documentation

## 2023-04-01 DIAGNOSIS — Z7184 Encounter for health counseling related to travel: Secondary | ICD-10-CM | POA: Insufficient documentation

## 2023-04-01 DIAGNOSIS — R7303 Prediabetes: Secondary | ICD-10-CM | POA: Insufficient documentation

## 2023-04-01 DIAGNOSIS — Z7689 Persons encountering health services in other specified circumstances: Secondary | ICD-10-CM | POA: Insufficient documentation

## 2023-04-01 MED ORDER — CLOBETASOL PROPIONATE 0.05 % EX CREA: 1.0000 | TOPICAL_CREAM | Freq: Two times a day (BID) | CUTANEOUS | 1 refills | Status: DC

## 2023-04-01 MED ORDER — MOUNJARO 2.5 MG/0.5ML ~~LOC~~ SOAJ: 2.5000 mg | SUBCUTANEOUS | 0 refills | Status: DC

## 2023-04-01 NOTE — Progress Notes (Signed)
 Shia,   Thyroid looks good.  Kidney, liver and look great.  A1C in pre-diabetes range but better than 6 months ago. Recheck in 6 months.  Continue to avoid sugars and carbs and aim for 150 minutes of exercise a week.   LDL, bad cholesterol, not to goal.  HDL, good cholesterol, looks GREAT.   Overall risk is below 7.5 but LDL is in range we would suggest starting a cholesterol lowering drug. Thoughts?   Marland KitchenMarland KitchenThe 10-year ASCVD risk score (Arnett DK, et al., 2019) is: 3.7%   Values used to calculate the score:     Age: 58 years     Sex: Female     Is Non-Hispanic African American: Yes     Diabetic: No     Tobacco smoker: No     Systolic Blood Pressure: 121 mmHg     Is BP treated: No     HDL Cholesterol: 67 mg/dL     Total Cholesterol: 274 mg/dL

## 2023-04-08 MED ORDER — ROSUVASTATIN CALCIUM 5 MG PO TABS: 5.0000 mg | ORAL_TABLET | Freq: Every day | ORAL | 3 refills | Status: DC

## 2023-04-08 NOTE — Addendum Note (Signed)
 Addended by: Jomarie Longs on: 04/08/2023 05:02 PM   Modules accepted: Orders

## 2023-04-09 ENCOUNTER — Other Ambulatory Visit (HOSPITAL_COMMUNITY): Payer: Self-pay

## 2023-04-09 ENCOUNTER — Telehealth: Payer: Self-pay

## 2023-04-09 NOTE — Telephone Encounter (Signed)
 Initiated Prior authorization ZOX:[id number] 2.5mg  Via: Covermymeds Case/Key:[id number] Status: denied as of 04/05/23 Reason:theres no indication that the pt has type 2 diabetes or any other  use is considered experimental(please see further details in media tab) Notified Pt via: Mychart

## 2023-04-12 ENCOUNTER — Other Ambulatory Visit: Payer: Self-pay

## 2023-04-12 MED ORDER — TIRZEPATIDE-WEIGHT MANAGEMENT 2.5 MG/0.5ML ~~LOC~~ SOLN: 2.5000 mg | SUBCUTANEOUS | 0 refills | Status: DC

## 2023-04-24 ENCOUNTER — Telehealth: Payer: Self-pay

## 2023-04-24 ENCOUNTER — Encounter: Payer: Self-pay | Admitting: Obstetrics and Gynecology

## 2023-04-24 NOTE — Telephone Encounter (Signed)
 Patient states that she understands our recommendations but has been doing research and it says that there are things that can be done prior to a year to ensure there is nothing to worry about. Her mother had endometrial cancer so she is stressed. If you think she will be ok to wait a year she will but she would rather address this sooner than later. Please advise.

## 2023-04-25 ENCOUNTER — Ambulatory Visit: Admitting: Nurse Practitioner

## 2023-04-25 ENCOUNTER — Encounter: Payer: Self-pay | Admitting: Nurse Practitioner

## 2023-04-25 VITALS — BP 122/80 | HR 84 | Ht 64.5 in | Wt 230.2 lb

## 2023-04-25 DIAGNOSIS — K219 Gastro-esophageal reflux disease without esophagitis: Secondary | ICD-10-CM | POA: Diagnosis not present

## 2023-04-25 DIAGNOSIS — E669 Obesity, unspecified: Secondary | ICD-10-CM

## 2023-04-25 NOTE — Progress Notes (Signed)
 04/25/2023 Anne Mason [id number] 1965-05-04   CHIEF COMPLAINT: Discuss a TIF procedure  HISTORY OF PRESENT ILLNESS: Anne Mason is a 58 year old female with a past medical history of obesity, hyperlipidemia and GERD.  She is known by Dr. Marina Goodell.  She presents today as referred by Tandy Gaw PA-C for further follow-up regarding chronic GERD and she wishes to pursue a transoral incision less fundoplication (TIF) with Dr. Barron Alvine.  She endorses taking omeprazole on and off for the past 15 years.  She follows a GERD diet which typically controls her reflux symptoms fairly well, however, she has active acid reflux symptoms approximately 10 days monthly for which she takes omeprazole 20 mg with symptom relief.  She infrequently takes Tums twice monthly for additional breakthrough symptoms.  Her acid reflux symptoms increased during times of travel.  She occasionally has reflux symptoms in the middle the night.  She denies having any upper or lower abdominal pain.  No dysphagia.  She wishes to lose weight and has been on Zepbound for the past year without losing weight.  She is concerned regarding chronic PPI use and wishes to proceed with an evaluation for possible TIF procedure.  She infrequently takes ibuprofen 2 tabs once every 2 months for headaches or pains.  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  She underwent an EGD 11/21/2018 which showed a small hiatal hernia and GERD.  Father with history of stomach cancer. She is passing normal formed brown bowel movement most days. She has hemorrhoids without bleeding or pain.  She underwent a screening colonoscopy 11/21/2018 which was normal.  No known family history of colorectal cancer.      Latest Ref Rng & Units 09/10/2022    3:13 PM 04/26/2021   11:05 AM 09/16/2018   11:11 AM  CBC  WBC 3.4 - 10.8 x10E3/uL 6.5  7.4  7.2   Hemoglobin 11.1 - 15.9 g/dL 62.9  52.8  41.3   Hematocrit 34.0  - 46.6 % 38.1  39.0  38.9   Platelets 150 - 450 x10E3/uL 221  222  252        Latest Ref Rng & Units 03/29/2023   11:48 AM 09/10/2022    3:13 PM 04/26/2021   11:05 AM  CMP  Glucose 70 - 99 mg/dL 91  73  96   BUN 6 - 24 mg/dL 16  15  18    Creatinine 0.57 - 1.00 mg/dL 2.44  0.10  2.72   Sodium 134 - 144 mmol/L 142  140  140   Potassium 3.5 - 5.2 mmol/L 4.4  4.0  4.3   Chloride 96 - 106 mmol/L 105  101  106   CO2 20 - 29 mmol/L 23  26  27    Calcium 8.7 - 10.2 mg/dL 9.3  9.1  9.3   Total Protein 6.0 - 8.5 g/dL 7.5   7.5   Total Bilirubin 0.0 - 1.2 mg/dL 0.3   0.3   Alkaline Phos 44 - 121 IU/L 98     AST 0 - 40 IU/L 23   15   ALT 0 - 32 IU/L 27   17    PAST GI PROCEDURES:   EGD 11/21/2018: 1. Normal EGD with small sliding hiatal hernia 2. GERD.  Colonoscopy 11/21/2018: - The entire examined colon is normal on direct and retroflexion views. - No specimens collected - Recall colonoscopy 10 years.   Past Medical  History:  Diagnosis Date   Elevated cholesterol    diet controlled - no meds   GERD (gastroesophageal reflux disease)    HSV infection    Obesity (BMI 30.0-34.9)    SVD (spontaneous vaginal delivery)    x 2   Past Surgical History:  Procedure Laterality Date   DILITATION & CURRETTAGE/HYSTROSCOPY WITH NOVASURE ABLATION N/A 01/13/2014   Procedure: DILATATION & CURETTAGE WITH NOVASURE ABLATION;  Surgeon: Allie Bossier, MD;  Location: [facility];  Service: Gynecology;  Laterality: N/A;   Social History: She is married.  She is a Community education officer.  She has 1 son and 1 daughter.  Non-smoker.  She drinks 1 alcoholic beverage once monthly or less.  No drug use.  Family History: family history includes Breast cancer in her maternal aunt; Diabetes in her maternal grandmother, mother, and sister; Endometrial cancer in her mother; Heart disease in her father, maternal grandmother, and mother; Hypertension in her maternal grandmother and mother; Kidney disease in her brother;  Stomach cancer in her father.  Allergies  Allergen Reactions   No Known Allergies       Outpatient Encounter Medications as of 04/25/2023  Medication Sig   clobetasol cream (TEMOVATE) 0.05 % Apply 1 Application topically 2 (two) times daily.   Multiple Vitamin (MULTIVITAMIN) tablet Take 1 tablet by mouth daily.   omeprazole (PRILOSEC) 20 MG capsule Take 20 mg by mouth daily.   tirzepatide (ZEPBOUND) 2.5 MG/0.5ML injection vial Inject 2.5 mg into the skin once a week.   atovaquone-proguanil (MALARONE) 250-100 MG TABS tablet Take 1 tablet by mouth daily. One tab PO daily starting 2 days before travel and finish 7 days after travel. (Patient not taking: Reported on 04/25/2023)   ciprofloxacin (CIPRO) 500 MG tablet Take 1 tablet (500 mg total) by mouth 2 (two) times daily. For 10 days. (Patient not taking: Reported on 04/25/2023)   clobetasol (OLUX) 0.05 % topical foam Apply topically 2 (two) times daily. (Patient not taking: Reported on 04/25/2023)   ondansetron (ZOFRAN-ODT) 8 MG disintegrating tablet Take 1 tablet (8 mg total) by mouth every 8 (eight) hours as needed. (Patient not taking: Reported on 04/25/2023)   rosuvastatin (CRESTOR) 5 MG tablet Take 1 tablet (5 mg total) by mouth daily. (Patient not taking: Reported on 04/25/2023)   typhoid (VIVOTIF) DR capsule Take 1 capsule by mouth every other day. (Patient not taking: Reported on 04/25/2023)   valACYclovir (VALTREX) 1000 MG tablet Take 1 tablet (1,000 mg total) by mouth daily. Take for 5 days (Patient not taking: Reported on 04/25/2023)   [DISCONTINUED] tirzepatide [facility]) 2.5 MG/0.5ML Pen Inject 2.5 mg into the skin once a week.   No facility-administered encounter medications on file as of 04/25/2023.   REVIEW OF SYSTEMS:  Gen: Denies fever, sweats or chills. No weight loss.  CV: Denies chest pain, palpitations or edema. Resp: Denies cough, shortness of breath of hemoptysis.  GI: See HPI. GU: Denies urinary burning, blood in urine,  increased urinary frequency or incontinence. MS: Denies joint pain, muscles aches or weakness. Derm: Denies rash, itchiness, skin lesions or unhealing ulcers. Psych: Denies depression, anxiety, memory loss or confusion. Heme: Denies bruising, easy bleeding. Neuro:  Denies headaches, dizziness or paresthesias. Endo:  Denies any problems with DM, thyroid or adrenal function.  PHYSICAL EXAM: BP 122/80 (BP Location: Left Arm, Patient Position: Sitting, Cuff Size: Large)   Pulse 84   Ht 5' 4.5" (1.638 m) Comment: height measured without shoes  Wt 230 lb 4  oz (104.4 kg)   LMP 11/23/2020   BMI 38.91 kg/m  BP 122/80 (BP Location: Left Arm, Patient Position: Sitting, Cuff Size: Large)   Pulse 84   Ht 5' 4.5" (1.638 m) Comment: height measured without shoes  Wt 230 lb 4 oz (104.4 kg)   LMP 11/23/2020   BMI 38.91 kg/m   Wt Readings from Last 3 Encounters:  04/25/23 230 lb 4 oz (104.4 kg)  03/29/23 226 lb 4 oz (102.6 kg)  03/27/23 226 lb (102.5 kg)    General: 58 year old female in no acute distress. Head: Normocephalic and atraumatic. Eyes:  Sclerae non-icteric, conjunctive pink. Ears: Normal auditory acuity. Mouth: Dentition intact. No ulcers or lesions.  Neck: Supple, no lymphadenopathy or thyromegaly.  Lungs: Clear bilaterally to auscultation without wheezes, crackles or rhonchi. Heart: Regular rate and rhythm. No murmur, rub or gallop appreciated.  Abdomen: Soft, nontender, nondistended. No masses. No hepatosplenomegaly. Normoactive bowel sounds x 4 quadrants.  Rectal: Deferred.  Musculoskeletal: Symmetrical with no gross deformities. Skin: Warm and dry. No rash or lesions on visible extremities. Extremities: No edema. Neurological: Alert oriented x 4, no focal deficits.  Psychological:  Alert and cooperative. Normal mood and affect.  ASSESSMENT AND PLAN:  58 year old female with chronic GERD, intermittently on PPI x 15+ years who wishes to pursue transoral incision less  fundoplication (TIF) with Dr. Barron Alvine.  - EGD with Dr. Barron Alvine benefits and risks discussed including risk with sedation, risk of bleeding, perforation and infection  - Patient instructed to hold his Zepbound for 1 week prior to EGD date - Goal BMI < 35 to be considered a candidate for TIF procedure - Exercise as tolerated and lose weight - Okay to continue Omeprazole 20 mg daily as needed - Recommended ENT consult with laryngoscopy secondary to intermittent voice hoarseness  Colon cancer screening.  Colonoscopy 11/2018 was normal. - Next screening colonoscopy due 11/2028    CC:  Jomarie Longs, PA-C

## 2023-04-25 NOTE — Patient Instructions (Addendum)
 You have been scheduled for an endoscopy. Please follow written instructions given to you at your visit today.  If you use inhalers (even only as needed), please bring them with you on the day of your procedure.  If you take any of the following medications, they will need to be adjusted prior to your procedure:   Zepbound- 7 days prior to your procedure _________________________________________________________________  Weight loss & exercise recommended.  Due to recent changes in healthcare laws, you may see the results of your imaging and laboratory studies on MyChart before your provider has had a chance to review them.  We understand that in some cases there may be results that are confusing or concerning to you. Not all laboratory results come back in the same time frame and the provider may be waiting for multiple results in order to interpret others.  Please give [place] 48 hours in order for your provider to thoroughly review all the results before contacting the office for clarification of your results.   Thank you for trusting me with your gastrointestinal care!   Alcide Evener, CRNP

## 2023-04-26 NOTE — Progress Notes (Signed)
 Not my area of expertise. I will leave all assessments, plans, and follow up, regarding this issue, between Dr. Salena Saner. and the patient.  Thanks, Dr. Marina Goodell

## 2023-05-09 ENCOUNTER — Other Ambulatory Visit: Payer: Self-pay | Admitting: Physician Assistant

## 2023-05-09 NOTE — Telephone Encounter (Signed)
 Requesting to go up on strength of Tirzepatide   Last written as 2.5mg  04/12/2023 Last OV 03/29/2023 Upcoming appt= none

## 2023-05-09 NOTE — Telephone Encounter (Signed)
 Copied from CRM [id number]. Topic: Clinical - Medication Refill >> May 09, 2023  9:53 AM Hamdi H wrote: Most Recent Primary Care Visit:  Provider: Araceli Knight  Department: [facility]  Visit Type: OFFICE VISIT  Date: 03/29/2023  Medication: tirzepatide  (ZEPBOUND ) 2.5 MG/0.5ML injection vial   **Patient is supposed to go up to 5 MG this month. She would like to know if there can be refills on this or should she call every month to get this refilled?  Has the patient contacted their pharmacy? Yes (Agent: If no, request that the patient contact the pharmacy for the refill. If patient does not wish to contact the pharmacy document the reason why and proceed with request.) (Agent: If yes, when and what did the pharmacy advise?) No refills remaining  Is this the correct pharmacy for this prescription? No If no, delete pharmacy and type the correct one.  This is the patient's preferred pharmacy:   [facility] [place], [place] - [phone] [place], [facility] [address]   Has the prescription been filled recently? No  Is the patient out of the medication? Yes  Has the patient been seen for an appointment in the last year OR does the patient have an upcoming appointment? Yes  Can we respond through MyChart? Yes  Agent: Please be advised that Rx refills may take up to 3 business days. We ask that you follow-up with your pharmacy.

## 2023-05-09 NOTE — Telephone Encounter (Signed)
 Patient requesting to increase strength of zepbound   Patient comment: I would like to go to the 5mg  if possible.  I am tolerating it well. Thanks!  Last written as 2.5mg  on 04/12/2023 Last OV 03/29/2023 Upcoming appt = none

## 2023-05-10 ENCOUNTER — Telehealth: Payer: Self-pay

## 2023-05-10 MED ORDER — TIRZEPATIDE-WEIGHT MANAGEMENT 5 MG/0.5ML ~~LOC~~ SOLN
5.0000 mg | SUBCUTANEOUS | 0 refills | Status: DC
Start: 2023-05-10 — End: 2023-10-25

## 2023-05-10 NOTE — Addendum Note (Signed)
 Addended by: Doretha Ganja on: 05/10/2023 08:12 AM   Modules accepted: Orders

## 2023-05-10 NOTE — Telephone Encounter (Signed)
>>   May 10, 2023  9:51 AM Corin V wrote: Patient stated that she got a call from the nurse about a prescription issue. [facility] should not be involved with [facility] script at all since insurance will not pay for it. It needs to go to [facility] as she is doing self pay for this script.

## 2023-05-10 NOTE — Telephone Encounter (Signed)
 Copied from CRM [id number]. Topic: Clinical - Medication Refill >> May 09, 2023  9:53 AM Hamdi H wrote: Most Recent Primary Care Visit:  Provider: Araceli Knight  Department: [facility]  Visit Type: OFFICE VISIT  Date: 03/29/2023  Medication: tirzepatide  (ZEPBOUND ) 2.5 MG/0.5ML injection vial   **Patient is supposed to go up to 5 MG this month. She would like to know if there can be refills on this or should she call every month to get this refilled?  Has the patient contacted their pharmacy? Yes (Agent: If no, request that the patient contact the pharmacy for the refill. If patient does not wish to contact the pharmacy document the reason why and proceed with request.) (Agent: If yes, when and what did the pharmacy advise?) No refills remaining  Is this the correct pharmacy for this prescription? No If no, delete pharmacy and type the correct one.  This is the patient's preferred pharmacy:   [facility] [place], [place] - [address]  [phone] [place], [facility] [phone]   Has the prescription been filled recently? No  Is the patient out of the medication? Yes  Has the patient been seen for an appointment in the last year OR does the patient have an upcoming appointment? Yes  Can we respond through MyChart? Yes  Agent: Please be advised that Rx refills may take up to 3 business days. We ask that you follow-up with your pharmacy. >> May 10, 2023  9:51 AM Corin V wrote: Patient stated that she got a call from the nurse about a prescription issue. [facility] not [facility] at [facility] since insurance will not pay for it. It needs to go to [facility] as she is doing self pay for this script.

## 2023-05-10 NOTE — Telephone Encounter (Signed)
 Pt is requesting to go up in zepbound  [facility] , [facility]  unsure how much to increase, left vm, for pt to call back

## 2023-05-10 NOTE — Addendum Note (Signed)
 Addended by: Araceli Knight on: 05/10/2023 12:50 PM   Modules accepted: Orders

## 2023-05-28 ENCOUNTER — Encounter: Payer: Self-pay | Admitting: Gastroenterology

## 2023-05-28 ENCOUNTER — Other Ambulatory Visit: Payer: Self-pay | Admitting: Physician Assistant

## 2023-05-28 DIAGNOSIS — L309 Dermatitis, unspecified: Secondary | ICD-10-CM

## 2023-05-31 ENCOUNTER — Telehealth: Payer: Self-pay | Admitting: Nurse Practitioner

## 2023-05-31 NOTE — Telephone Encounter (Signed)
 Good morning Dr. Karene Oto, I received a call from this patient stating that she is scheduled for a procedure on May the 20 th, but is needing to cancel this appointment due to her "getting further down the road with her weight loss plan". Patient did stated that she would call back in the fall the get her EGD procedure rescheduled. Please advise.

## 2023-06-04 ENCOUNTER — Encounter: Admitting: Gastroenterology

## 2023-06-12 ENCOUNTER — Ambulatory Visit

## 2023-07-04 ENCOUNTER — Other Ambulatory Visit: Payer: Self-pay | Admitting: Physician Assistant

## 2023-08-06 ENCOUNTER — Other Ambulatory Visit: Payer: Self-pay | Admitting: Physician Assistant

## 2023-08-06 DIAGNOSIS — L309 Dermatitis, unspecified: Secondary | ICD-10-CM

## 2023-09-20 ENCOUNTER — Encounter: Payer: Self-pay | Admitting: Physician Assistant

## 2023-09-23 MED ORDER — TIRZEPATIDE-WEIGHT MANAGEMENT 7.5 MG/0.5ML ~~LOC~~ SOLN: 7.5000 mg | SUBCUTANEOUS | 0 refills | Status: DC

## 2023-10-25 ENCOUNTER — Ambulatory Visit: Admitting: Physician Assistant

## 2023-10-25 ENCOUNTER — Encounter: Payer: Self-pay | Admitting: Physician Assistant

## 2023-10-25 VITALS — BP 141/95 | HR 71 | Ht 64.0 in | Wt 207.0 lb

## 2023-10-25 DIAGNOSIS — Z6835 Body mass index (BMI) 35.0-35.9, adult: Secondary | ICD-10-CM

## 2023-10-25 DIAGNOSIS — Z91013 Allergy to seafood: Secondary | ICD-10-CM

## 2023-10-25 DIAGNOSIS — E66812 Obesity, class 2: Secondary | ICD-10-CM | POA: Diagnosis not present

## 2023-10-25 DIAGNOSIS — Z23 Encounter for immunization: Secondary | ICD-10-CM | POA: Diagnosis not present

## 2023-10-25 DIAGNOSIS — Z7689 Persons encountering health services in other specified circumstances: Secondary | ICD-10-CM

## 2023-10-25 DIAGNOSIS — R7303 Prediabetes: Secondary | ICD-10-CM

## 2023-10-25 DIAGNOSIS — F5101 Primary insomnia: Secondary | ICD-10-CM

## 2023-10-25 DIAGNOSIS — L853 Xerosis cutis: Secondary | ICD-10-CM | POA: Diagnosis not present

## 2023-10-25 MED ORDER — EPINEPHRINE 0.3 MG/0.3ML IJ SOAJ: 0.3000 mg | Freq: Once | INTRAMUSCULAR | 1 refills | Status: AC

## 2023-10-25 MED ORDER — ZEPBOUND 10 MG/0.5ML ~~LOC~~ SOAJ: 10.0000 mg | SUBCUTANEOUS | 1 refills | Status: DC

## 2023-10-25 NOTE — Patient Instructions (Addendum)
 Epipen sent to pharmacy.  Labs ordered for testing today Zepbound  increased to 10mg  weekly Varlerian root/ashwaganda/magnesium glycinate

## 2023-10-25 NOTE — Progress Notes (Signed)
 Established Patient Office Visit  Subjective   Patient ID: Anne Mason, female    DOB: [birth year]  Age: 58 y.o. MRN: [id number]  HPI Discussed the use of AI scribe software for clinical note transcription with the patient, who gave verbal consent to proceed.  History of Present Illness Anne Mason is a 58 year old female who presents for a follow-up regarding sleep issues and a recent shrimp allergy.  Sleep disturbance - Difficulty maintaining sleep, with frequent awakenings at 2 AM and inability to fall back asleep - Symptoms have progressively worsened over recent years - Caffeine intake significantly reduced, nearly eliminating coffee; currently drinks tea made from [facility], [place] - No use of natural sleep aids to date, but considering ashwagandha and magnesium  Allergic reaction to shellfish - Recent allergic reaction to shrimp characterized by hives on hands and splotchy areas on face - First incident required emergency room visit with associated vomiting - Symptoms resolved with Benadryl - No lip or tongue swelling  Medication side effects (tirzepatide ) - Currently taking tirzepatide  7.5 mg - Tolerates medication well - Constipation as the only side effect - Constipation managed by dietary modifications: elimination of dairy, cheese, beef, and pork; increased intake of vegetables from her garden - Weight loss of 23 pounds since April  Xerosis (dry skin) - Severe dry skin on hands for the past three months, with peeling around the cuticles - No increased hand washing, no new lotions - Dryness is non-pruritic - Uses Cetaphil and CeraVe lotions; also carries scented lotions in her purse     ROS See HPI.    Objective:     BP (!) 141/95   Pulse 71   Ht 5' 4 (1.626 m)   Wt 207 lb (93.9 kg)   LMP 11/23/2020   SpO2 99%   BMI 35.53 kg/m  BP Readings from Last 3 Encounters:  10/25/23 (!) 141/95  04/25/23 122/80  03/29/23 121/81   Wt Readings  from Last 3 Encounters:  10/25/23 207 lb (93.9 kg)  04/25/23 230 lb 4 oz (104.4 kg)  03/29/23 226 lb 4 oz (102.6 kg)      Physical Exam Constitutional:      Appearance: Normal appearance. She is obese.  Cardiovascular:     Rate and Rhythm: Normal rate and regular rhythm.  Pulmonary:     Effort: Pulmonary effort is normal.  Skin:    Comments: Peeling around nail cuticles, dry appearance to bilateral hands.   Neurological:     Mental Status: She is alert and oriented to person, place, and time.  Psychiatric:        Mood and Affect: Mood normal.      The 10-year ASCVD risk score (Arnett DK, et al., 2019) is: 6.7%    Assessment & Plan:  .Diagnoses and all orders for this visit:  Primary insomnia  Shrimp allergy -     EPINEPHrine 0.3 mg/0.3 mL IJ SOAJ injection; Inject 0.3 mg into the muscle once for 1 dose. -     CMP14+EGFR -     Allergen food profile specific IgE -     Shrimp IgE  Encounter for weight management -     tirzepatide  (ZEPBOUND ) 10 MG/0.5ML Pen; Inject 10 mg into the skin once a week. -     CMP14+EGFR  Pre-diabetes -     tirzepatide  (ZEPBOUND ) 10 MG/0.5ML Pen; Inject 10 mg into the skin once a week. -     CMP14+EGFR -  Hemoglobin A1c  Xerosis of skin  Class 2 severe obesity due to excess calories with serious comorbidity and body mass index (BMI) of 35.0 to 35.9 in adult -     tirzepatide  (ZEPBOUND ) 10 MG/0.5ML Pen; Inject 10 mg into the skin once a week.  Encounter for immunization -     Flu vaccine trivalent PF, 6mos and older(Flulaval,Afluria,Fluarix,Fluzone) -     Pneumococcal conjugate vaccine 20-valent   Assessment & Plan Insomnia Chronic sleep initiation and maintenance issues, worsening over years. Discussed sleep's role in weight management and decision-making. - Recommend ashwagandha 300 mg and magnesium glycinate 400 mg. - Advise on sleep hygiene: cooler room, consistent bedtime, avoid blue light. - Suggest valerian root as  additional sleep aid. - Consider herbal remedies from [employer]. - follow up as needed in 2 months to consider other medication options  Seafood allergy Recent shrimp allergy with hives, no anaphylaxis. Discussed avoidance and emergency management with Benadryl and EpiPen. - Prescribe EpiPen for emergency use. - Advise avoidance of shrimp. - Order IgE panel for common food allergens.  Dry skin of hands Severe dry skin with peeling, no itching. Discussed irritants and moisturizing strategies. - Recommend unscented lotions such as Cetaphil or CeraVe. - Advise avoiding scented lotions and potential irritants. - Suggest paraffin wax treatment or using gloves after lotion.  Pre-diabetes Well-controlled with 27-pound weight loss. Tolerating tirzepatide  with minor constipation. Discussed protein intake to prevent muscle wasting. - Increase tirzepatide  to 10 mg. - Ensure adequate protein intake (60-80 grams daily).  General Health Maintenance Discussed vaccinations and timing to avoid overwhelming immune response. - Administer flu and pneumonia vaccines today. - Schedule COVID and shingles vaccines in three weeks.    Return in about 6 months (around 04/24/2024) for 2 weeks nurse visit for covid and shingles vaccine/ 6 months on weight.    [place] Mullett, PA-C

## 2023-10-28 ENCOUNTER — Ambulatory Visit: Payer: Self-pay | Admitting: Physician Assistant

## 2023-10-28 DIAGNOSIS — R0683 Snoring: Secondary | ICD-10-CM

## 2023-10-28 DIAGNOSIS — G478 Other sleep disorders: Secondary | ICD-10-CM

## 2023-10-28 DIAGNOSIS — Z6835 Body mass index (BMI) 35.0-35.9, adult: Secondary | ICD-10-CM

## 2023-10-28 DIAGNOSIS — R0681 Apnea, not elsewhere classified: Secondary | ICD-10-CM

## 2023-10-28 DIAGNOSIS — R7303 Prediabetes: Secondary | ICD-10-CM

## 2023-10-28 DIAGNOSIS — E7800 Pure hypercholesterolemia, unspecified: Secondary | ICD-10-CM

## 2023-10-28 DIAGNOSIS — L309 Dermatitis, unspecified: Secondary | ICD-10-CM

## 2023-10-28 LAB — CMP14+EGFR
ALT: 21 IU/L (ref 0–32)
AST: 18 IU/L (ref 0–40)
Albumin: 4.4 g/dL (ref 3.8–4.9)
Alkaline Phosphatase: 89 IU/L (ref 49–135)
BUN/Creatinine Ratio: 16 (ref 9–23)
BUN: 14 mg/dL (ref 6–24)
Bilirubin Total: 0.4 mg/dL (ref 0.0–1.2)
CO2: 25 mmol/L (ref 20–29)
Calcium: 9.9 mg/dL (ref 8.7–10.2)
Chloride: 101 mmol/L (ref 96–106)
Creatinine, Ser: 0.87 mg/dL (ref 0.57–1.00)
Globulin, Total: 3.1 g/dL (ref 1.5–4.5)
Glucose: 92 mg/dL (ref 70–99)
Potassium: 4.1 mmol/L (ref 3.5–5.2)
Sodium: 139 mmol/L (ref 134–144)
Total Protein: 7.5 g/dL (ref 6.0–8.5)
eGFR: 77 mL/min/1.73 (ref >59)

## 2023-10-28 LAB — ALLERGEN FOOD PROFILE SPECIFIC IGE
Allergen Apple, IgE: <0.1 kU/L
Allergen Corn, IgE: <0.1 kU/L
Allergen Tomato, IgE: <0.1 kU/L
Chicken IgE: <0.1 kU/L
Codfish IgE: <0.1 kU/L
Egg White IgE: <0.1 kU/L
IgE (Immunoglobulin E), Serum: 78 [IU]/mL (ref 6–495)
Milk IgE: <0.1 kU/L
Orange: <0.1 kU/L
Peanut IgE: <0.1 kU/L
Shrimp IgE: <0.1 kU/L
Soybean IgE: <0.1 kU/L
Tuna: <0.1 kU/L
Wheat IgE: <0.1 kU/L

## 2023-10-28 LAB — HEMOGLOBIN A1C
Est. average glucose Bld gHb Est-mCnc: 126 mg/dL
Hgb A1c MFr Bld: 6 % — ABNORMAL HIGH (ref 4.8–5.6)

## 2023-10-28 NOTE — Telephone Encounter (Signed)
 Please call and do stop bang over the phone for a screening questionnaire. Then if patient is higher risk we can order sleep study to do at home.

## 2023-10-28 NOTE — Progress Notes (Signed)
 No IgE response to shrimp or the entire list of foods tested. It could be an ingredient used with the shrimp that you are responding to.

## 2023-10-28 NOTE — Progress Notes (Signed)
 Pam,   A1C decreased some to 6. Still in diabetes range but improvement!   Kidney, liver, glucose look good.   Allergry panel still pending.

## 2023-10-29 NOTE — Telephone Encounter (Signed)
Pended referral for sleep study

## 2023-10-29 NOTE — Addendum Note (Signed)
 Addended by: Joshu Furukawa P on: 10/29/2023 09:25 AM   Modules accepted: Orders

## 2023-10-29 NOTE — Telephone Encounter (Signed)
 LM for pt to return call to go over stop bang with her.

## 2023-10-29 NOTE — Telephone Encounter (Signed)
 error

## 2023-10-30 DIAGNOSIS — R0683 Snoring: Secondary | ICD-10-CM | POA: Insufficient documentation

## 2023-10-30 DIAGNOSIS — R0681 Apnea, not elsewhere classified: Secondary | ICD-10-CM | POA: Insufficient documentation

## 2023-10-30 DIAGNOSIS — G478 Other sleep disorders: Secondary | ICD-10-CM | POA: Insufficient documentation

## 2023-10-30 NOTE — Progress Notes (Signed)
 Order for home sleep testing sent to [facility]. STOP BANG 5/8, high risk for apnea.

## 2023-10-30 NOTE — Addendum Note (Signed)
 Addended by: ANTONIETTE VERMELL CROME on: 10/30/2023 03:50 PM   Modules accepted: Orders

## 2023-11-07 ENCOUNTER — Other Ambulatory Visit: Payer: Self-pay | Admitting: Physician Assistant

## 2023-11-07 DIAGNOSIS — L219 Seborrheic dermatitis, unspecified: Secondary | ICD-10-CM

## 2023-11-07 MED ORDER — OMEPRAZOLE 20 MG PO CPDR: 20.0000 mg | DELAYED_RELEASE_CAPSULE | Freq: Every day | ORAL | 3 refills | Status: AC

## 2023-11-07 MED ORDER — CLOBETASOL PROPIONATE 0.05 % EX CREA: TOPICAL_CREAM | Freq: Two times a day (BID) | CUTANEOUS | 2 refills | Status: AC

## 2023-11-07 NOTE — Addendum Note (Signed)
 Addended byBETHA DUWAINE RIGGS on: 11/07/2023 12:24 PM   Modules accepted: Orders

## 2023-11-07 NOTE — Addendum Note (Signed)
 Addended by: ANTONIETTE VERMELL CROME on: 11/07/2023 12:30 PM   Modules accepted: Orders

## 2023-11-08 MED ORDER — CLOBETASOL PROPIONATE 0.05 % EX FOAM: Freq: Two times a day (BID) | CUTANEOUS | 1 refills | Status: AC

## 2023-11-08 NOTE — Addendum Note (Signed)
 Addended by: ANTONIETTE VERMELL CROME on: 11/08/2023 10:23 AM   Modules accepted: Orders

## 2023-11-20 NOTE — Telephone Encounter (Signed)
 Can we send this to PA to try for sleep apnea dx to get zepbound ?

## 2023-11-21 ENCOUNTER — Other Ambulatory Visit (HOSPITAL_COMMUNITY): Payer: Self-pay

## 2023-11-21 ENCOUNTER — Telehealth: Payer: Self-pay

## 2023-11-21 NOTE — Telephone Encounter (Signed)
 Pharmacy Patient Advocate Encounter   Received notification from Patient Advice Request messages that prior authorization for Zepbound  10mg /0.53ml is required/requested.   Insurance verification completed.   The patient is insured through [employer].     Zepbound  is a plan exclusion and is not covered. Patient can try to see if she is eligible for savings at [email or web address] .[email or web address]-savings or the [facility] Self pay says it would be $499 for the vials would come from them.

## 2023-11-27 MED ORDER — TIRZEPATIDE 10 MG/0.5ML ~~LOC~~ SOAJ: 10.0000 mg | SUBCUTANEOUS | 0 refills | Status: AC

## 2023-11-27 NOTE — Addendum Note (Signed)
 Addended by: ANTONIETTE VERMELL CROME on: 11/27/2023 12:55 PM   Modules accepted: Orders

## 2023-11-29 ENCOUNTER — Other Ambulatory Visit: Payer: Self-pay | Admitting: Physician Assistant

## 2023-11-29 NOTE — Telephone Encounter (Signed)
 Per patient she has been told by insurance they would cover with enough cardiovascular data and/or hyperglycemia.   LDL 185 Highest A1C 6.4 Obese OSA confirmed with sleep study  .SABRAThe 10-year ASCVD risk score (Arnett DK, et al., 2019) is: 6.7%   Values used to calculate the score:     Age: 58 years     Clincally relevant sex: Female     Is Non-Hispanic African American: Yes     Diabetic: No     Tobacco smoker: No     Systolic Blood Pressure: 141 mmHg     Is BP treated: No     HDL Cholesterol: 67 mg/dL     Total Cholesterol: 274 mg/dL

## 2023-12-04 ENCOUNTER — Ambulatory Visit
Admission: RE | Admit: 2023-12-04 | Discharge: 2023-12-04 | Disposition: A | Source: Ambulatory Visit | Attending: Obstetrics and Gynecology

## 2023-12-04 DIAGNOSIS — Z01419 Encounter for gynecological examination (general) (routine) without abnormal findings: Secondary | ICD-10-CM

## 2024-02-15 ENCOUNTER — Encounter: Payer: Self-pay | Admitting: Physician Assistant

## 2024-02-15 DIAGNOSIS — R0683 Snoring: Secondary | ICD-10-CM

## 2024-02-15 DIAGNOSIS — G4733 Obstructive sleep apnea (adult) (pediatric): Secondary | ICD-10-CM

## 2024-02-15 DIAGNOSIS — R0681 Apnea, not elsewhere classified: Secondary | ICD-10-CM

## 2024-02-15 DIAGNOSIS — G478 Other sleep disorders: Secondary | ICD-10-CM

## 2024-02-15 DIAGNOSIS — Z6835 Body mass index (BMI) 35.0-35.9, adult: Secondary | ICD-10-CM

## 2024-02-17 DIAGNOSIS — G4733 Obstructive sleep apnea (adult) (pediatric): Secondary | ICD-10-CM | POA: Insufficient documentation

## 2024-02-17 MED ORDER — TIRZEPATIDE-WEIGHT MANAGEMENT 7.5 MG/0.5ML ~~LOC~~ SOLN: 7.5000 mg | SUBCUTANEOUS | 1 refills | Status: AC

## 2024-02-17 NOTE — Telephone Encounter (Signed)
 Copied from CRM [id number]. Topic: Clinical - Prescription Issue >> Feb 17, 2024 10:47 AM [name] wrote:  Reason for CRM: Patient says she is taking Zepbound  7.5. Patient does not want to move to the 10 mg.  The patient's chart is showing Mounjaro10mg  and not Zepbound . Patient is okay to take whatever her insurance will approve whether Zepbound  or Mounjaro . It appears Zepbound  was previously denied and patient has been paying out of pocket. Please clarify and refill, Patient CB# [phone]

## 2024-04-24 ENCOUNTER — Ambulatory Visit: Admitting: Physician Assistant

## 2024-05-08 ENCOUNTER — Ambulatory Visit
